# Patient Record
Sex: Female | Born: 1960 | Hispanic: Refuse to answer | Marital: Married | Smoking: Never smoker
Health system: Southern US, Community
[De-identification: ages and names within clinical notes are randomized; demographics above are authoritative.]

## PROBLEM LIST (undated history)

## (undated) DIAGNOSIS — T7840XA Allergy, unspecified, initial encounter: Secondary | ICD-10-CM

## (undated) DIAGNOSIS — M797 Fibromyalgia: Secondary | ICD-10-CM

## (undated) DIAGNOSIS — K635 Polyp of colon: Secondary | ICD-10-CM

## (undated) DIAGNOSIS — E119 Type 2 diabetes mellitus without complications: Secondary | ICD-10-CM

## (undated) HISTORY — PX: TONSILLECTOMY: SUR1361

## (undated) HISTORY — PX: TOTAL ABDOMINAL HYSTERECTOMY: SHX209

## (undated) HISTORY — DX: Polyp of colon: K63.5

## (undated) HISTORY — PX: FEMUR FRACTURE SURGERY: SHX633

## (undated) HISTORY — DX: Allergy, unspecified, initial encounter: T78.40XA

## (undated) HISTORY — DX: Fibromyalgia: M79.7

## (undated) HISTORY — PX: BLADDER SURGERY: SHX569

## (undated) HISTORY — PX: CHOLECYSTECTOMY: SHX55

## (undated) HISTORY — PX: APPENDECTOMY: SHX54

## (undated) HISTORY — PX: LEG SURGERY: SHX1003

---

## 2010-10-07 ENCOUNTER — Encounter: Payer: Self-pay | Admitting: Pulmonary Disease

## 2010-10-07 ENCOUNTER — Other Ambulatory Visit: Payer: Self-pay | Admitting: Pulmonary Disease

## 2010-10-07 ENCOUNTER — Institutional Professional Consult (permissible substitution) (INDEPENDENT_AMBULATORY_CARE_PROVIDER_SITE_OTHER): Payer: BC Managed Care – PPO | Admitting: Pulmonary Disease

## 2010-10-07 ENCOUNTER — Ambulatory Visit (INDEPENDENT_AMBULATORY_CARE_PROVIDER_SITE_OTHER)
Admission: RE | Admit: 2010-10-07 | Discharge: 2010-10-07 | Disposition: A | Discharge status: Home / Self Care | Payer: BC Managed Care – PPO | Source: Ambulatory Visit | Attending: Pulmonary Disease | Admitting: Pulmonary Disease

## 2010-10-07 DIAGNOSIS — J209 Acute bronchitis, unspecified: Secondary | ICD-10-CM

## 2010-10-07 DIAGNOSIS — J309 Allergic rhinitis, unspecified: Secondary | ICD-10-CM | POA: Insufficient documentation

## 2010-10-07 DIAGNOSIS — K219 Gastro-esophageal reflux disease without esophagitis: Secondary | ICD-10-CM

## 2010-10-07 DIAGNOSIS — J45909 Unspecified asthma, uncomplicated: Secondary | ICD-10-CM | POA: Insufficient documentation

## 2010-10-14 NOTE — Assessment & Plan Note (Signed)
Summary: self referral for cough, congestion, reflux.   Vital Signs:  Patient profile:   50 year old female Height:      60 inches Weight:      151 pounds BMI:     29.60 O2 Sat:      98 % on Room air Temp:     98.1 degrees F oral Pulse rate:   75 / minute BP sitting:   126 / 80  (left arm) Cuff size:   regular  Vitals Entered By: Arman Filter LPN (October 07, 2010 3:58 PM)  O2 Flow:  Room air CC: Pulmonary Consult Comments Medications reviewed with patient Arman Filter LPN  October 07, 2010 4:00 PM    Copy to:  Self Referral Primary Provider/Referring Provider:  None  CC:  Pulmonary Consult.  History of Present Illness: The pt is a 49y/o female who comes in today as a self referral for cough, congestion, and chest tightness.  She just moved here from [place] 2 weeks ago, and was doing well until 4 days ago.  She began to develop cough, congestion, scant quantity of yellow mucus, and chest tightness.  She also had some increase in sob and severe reflux symptoms.  It should be noted the entire history was obtained thru an interpreter.  She apparently has a h/o asthma for the last 3 yrs, and has been maintained on symbicort.  She has been well controlled, and notes only one flare in the last year besides this episode.  She feels this is different from prior respiratory illnesses.  She recently saw someone at "urgent care", and was put on biaxin.  She has not been able to take this due to worsening reflux symptoms.  She has never been treated with prednisone to her knowledge.  She has noted some sinus congestion with nonpurulent drainage, but this is not an ongoing problem.    Past History:  Past Medical History: ?h/o asthma ALLERGIC RHINITIS (ICD-477.9)    Past Surgical History: total hysterectomy appendectomy   Family History: Reviewed history and no changes required. noncontributory  Social History: Reviewed history and no changes required. single, no  children never smoked moved here 2 weeks ago from [place]  Review of Systems       The patient complains of productive cough, chest pain, and acid heartburn.  The patient denies shortness of breath with activity, shortness of breath at rest, non-productive cough, coughing up blood, irregular heartbeats, indigestion, loss of appetite, weight change, abdominal pain, difficulty swallowing, sore throat, tooth/dental problems, headaches, nasal congestion/difficulty breathing through nose, sneezing, itching, ear ache, anxiety, depression, hand/feet swelling, joint stiffness or pain, rash, change in color of mucus, and fever.    Physical Exam  General:  ow female in nad Eyes:  PERRLA and EOMI.   Nose:  patent without discharge no purulence seen Mouth:  clear, no thrush or exudates seen Neck:  no jvd,tmg, LN Lungs:  clear to auscultation, no wheezing or rhonchi Heart:  rrr, no mrg  Abdomen:  soft and nontender, bs+ Extremities:  no edema or cyanosis  pulses intact distally Neurologic:  alert and oriented, moves all 4. extremities.   Impression & Recommendations:  Problem # 1:  BRONCHITIS, ACUTE (ICD-466.0) the pt's hx is most c/w an acute bronchitis, with cough/congestion and purulence.  This may have been all started by reflux based on her history.  Will treat with avelox since she was unable to tolerate biaxin.  Will also check a  cxr to make sure this isn't pna or another acute process.  Problem # 2:  GERD, SEVERE (ICD-530.81) the pt is describing chest tightness with sob, but is also having severe reflux symptoms relayed thru the interpreter.  I initially though this may be due to an asthma flare, but her spirometry was totally normal and her exam had no wheezing.  I suspect a lot of this is due to reflux, but again will check cxr for completeness.  Will treat with two times a day ppi, and will have to consider at f/u if she even truly has asthma.  She is to stay on her maintenance  asthma meds at this time.  Medications Added to Medication List This Visit: 1)  Omeprazole 40 Mg Cpdr (Omeprazole) .... One in am and pm  Other Orders: New Patient Level V (16109) Spirometry w/Graph (94010) T-2 View CXR (71020TC)  Patient Instructions: 1)  stay on symbicort 2 puffs am and pm...rinse mouth well after using and gargle.  Take everyday no matter how you feel. 2)  proventil inhaler 2 puffs every 6hrs only if an emergency and you are away from home.  Only use your nebulizer at home if you are having a lot of problems. 3)  will check cxr today, and call you with results. 4)  dexilant 60mg  one each am and pm each day...for acid reflux until samples are gone, then start taking omeprazole 40mg  one am and pm thereafter. 5)  avelox 400mg  one each day for 5 days for bronchitis 6)  followup with me in 2 weeks, but call if not getting better.  Prescriptions: OMEPRAZOLE 40 MG CPDR (OMEPRAZOLE) one in am and pm  #60 x 6   Entered and Authorized by:   Barbaraann Share MD   Signed by:   Barbaraann Share MD on 10/07/2010   Method used:   Print then Give to Patient   RxID:   [id number]    Orders Added: 1)  New Patient Level V [99205] 2)  Spirometry w/Graph [94010] 3)  T-2 View CXR [71020TC]

## 2010-10-21 ENCOUNTER — Ambulatory Visit (INDEPENDENT_AMBULATORY_CARE_PROVIDER_SITE_OTHER): Payer: BC Managed Care – PPO | Admitting: Pulmonary Disease

## 2010-10-21 ENCOUNTER — Encounter: Payer: Self-pay | Admitting: Pulmonary Disease

## 2010-10-21 DIAGNOSIS — K219 Gastro-esophageal reflux disease without esophagitis: Secondary | ICD-10-CM

## 2010-10-21 DIAGNOSIS — J45909 Unspecified asthma, uncomplicated: Secondary | ICD-10-CM

## 2010-11-12 NOTE — Assessment & Plan Note (Signed)
Summary: rov for ?asthma, severe reflux   Copy to:  Self Referral Primary Provider/Referring Provider:  None  CC:  2 week f/u appt.  Pt states she has finished abx and is "feeling better."  Denies sob.  States she will cough up small amounts of yellow sputum. Shannon Schmitt  History of Present Illness: the pt comes in today for f/u of her pulmonary symptoms.  She has a h/o "asthma", and has been having worsening symptoms despite being on a good asthma regimen.  She has also been having severe reflux symptoms, and this was felt to be aggrevating her asthma symptoms.  Last visit she was treated with an abx for probable acute bronchitis, and also started on two times a day PPI.  She comes in today where she is much improved.  She has not had breakthru reflux, and no chest tightness/burning.  Her breathing is much improved.    Current Medications (verified): 1)  Omeprazole 40 Mg Cpdr (Omeprazole) .... One in Am and Pm 2)  Symbicort 160-4.5 Mcg/act  Aero (Budesonide-Formoterol Fumarate) .... Two Puffs Twice Daily 3)  Proventil Hfa 108 (90 Base) Mcg/act  Aers (Albuterol Sulfate) .Shannon Schmitt.. 1-2 Puffs Every 4-6 Hours As Needed  Allergies (verified): 1)  ! Penicillin 2)  ! Aspirin 3)  ! Codeine 4)  ! * Shellfish  Review of Systems       The patient complains of productive cough, acid heartburn, indigestion, headaches, and change in color of mucus.  The patient denies shortness of breath with activity, shortness of breath at rest, non-productive cough, coughing up blood, chest pain, irregular heartbeats, loss of appetite, weight change, abdominal pain, difficulty swallowing, sore throat, tooth/dental problems, nasal congestion/difficulty breathing through nose, sneezing, itching, ear ache, anxiety, depression, hand/feet swelling, joint stiffness or pain, rash, and fever.    Vital Signs:  Patient profile:   50 year old female Height:      60 inches Weight:      151.38 pounds O2 Sat:      94 % on Room air Temp:      97.8 degrees F oral Pulse rate:   87 / minute BP sitting:   106 / 76  (left arm) Cuff size:   regular  Vitals Entered By: Arman Filter LPN (October 21, 2010 9:11 AM)  O2 Flow:  Room air CC: 2 week f/u appt.  Pt states she has finished abx and is "feeling better."  Denies sob.  States she will cough up small amounts of yellow sputum.  Comments Medications reviewed with patient Arman Filter LPN  October 21, 2010 9:11 AM    Physical Exam  General:  ow female in nad Lungs:  totally clear to auscultation Heart:  rrr Extremities:  no edema or cyanosis  Neurologic:  alert and oriented, moves all 4.   Impression & Recommendations:  Problem # 1:  INTRINSIC ASTHMA, UNSPECIFIED (ICD-493.10) the pt is much improved from a pulmonary standpoint, with only a minimal cough residual.  It remains unclear to me if she truly has asthma vs RAD associated with severe reflux.  Will continue her on her current regimen, and let the "smoke clear".  If she is doing well at the next visit, I would try to stop the symbicort and see if she has recurrence of symptoms while on aggressive treatment for acid reflux.   Problem # 2:  GERD, SEVERE (ICD-530.81) much improved on two times a day PPI.  I have asked her to continue.  Medications Added to Medication List This Visit: 1)  Symbicort 160-4.5 Mcg/act Aero (Budesonide-formoterol fumarate) .... Two puffs twice daily 2)  Proventil Hfa 108 (90 Base) Mcg/act Aers (Albuterol sulfate) .Shannon Schmitt.. 1-2 puffs every 4-6 hours as needed  Other Orders: Est. Patient Level III (16109)  Patient Instructions: 1)  stay on symbicort, and proventil as needed 2)  stay on acid reflux medication one in am and pm 3)  please call if you are not doing well 4)  followup with me in 2mos.   Prescriptions: PROVENTIL HFA 108 (90 BASE) MCG/ACT  AERS (ALBUTEROL SULFATE) 1-2 puffs every 4-6 hours as needed  #1 x 6   Entered and Authorized by:   Barbaraann Share MD   Signed by:   Barbaraann Share MD on 10/21/2010   Method used:   Print then Give to Patient   RxID:   [id number] SYMBICORT 160-4.5 MCG/ACT  AERO (BUDESONIDE-FORMOTEROL FUMARATE) Two puffs twice daily  #1 x 6   Entered and Authorized by:   Barbaraann Share MD   Signed by:   Barbaraann Share MD on 10/21/2010   Method used:   Print then Give to Patient   RxID:   [id number]

## 2010-12-05 ENCOUNTER — Emergency Department (HOSPITAL_COMMUNITY)
Admission: EM | Admit: 2010-12-05 | Discharge: 2010-12-05 | Disposition: A | Discharge status: Home / Self Care | Payer: MEDICARE | Attending: Emergency Medicine | Admitting: Emergency Medicine

## 2010-12-05 DIAGNOSIS — H5710 Ocular pain, unspecified eye: Secondary | ICD-10-CM | POA: Insufficient documentation

## 2010-12-05 DIAGNOSIS — B0052 Herpesviral keratitis: Secondary | ICD-10-CM | POA: Insufficient documentation

## 2010-12-18 ENCOUNTER — Encounter: Payer: Self-pay | Admitting: Pulmonary Disease

## 2010-12-21 ENCOUNTER — Ambulatory Visit: Payer: BC Managed Care – PPO | Admitting: Pulmonary Disease

## 2011-10-26 ENCOUNTER — Other Ambulatory Visit: Payer: Self-pay | Admitting: Pulmonary Disease

## 2012-09-29 ENCOUNTER — Ambulatory Visit: Payer: MEDICARE | Admitting: Adult Health

## 2012-09-29 ENCOUNTER — Encounter: Payer: Self-pay | Admitting: Pulmonary Disease

## 2012-09-29 ENCOUNTER — Ambulatory Visit (INDEPENDENT_AMBULATORY_CARE_PROVIDER_SITE_OTHER): Payer: BC Managed Care – PPO | Admitting: Pulmonary Disease

## 2012-09-29 VITALS — BP 120/72 | HR 65 | Temp 97.5°F | Ht <= 58 in | Wt 162.4 lb

## 2012-09-29 DIAGNOSIS — J45901 Unspecified asthma with (acute) exacerbation: Secondary | ICD-10-CM

## 2012-09-29 DIAGNOSIS — K219 Gastro-esophageal reflux disease without esophagitis: Secondary | ICD-10-CM

## 2012-09-29 DIAGNOSIS — J45909 Unspecified asthma, uncomplicated: Secondary | ICD-10-CM

## 2012-09-29 MED ORDER — PREDNISONE 10 MG PO TABS: ORAL_TABLET | ORAL | Status: DC

## 2012-09-29 MED ORDER — LEVOFLOXACIN 750 MG PO TABS: 750.0000 mg | ORAL_TABLET | Freq: Every day | ORAL | Status: DC

## 2012-09-29 NOTE — Assessment & Plan Note (Signed)
The patient's history is most consistent with an acute bronchitis with asthma exacerbation.  She will need to be treated with a course of prednisone and antibiotics, and will also need to get back on her maintenance medications.  I have given her samples of symbicort, and written a prescription.  Also stressed to her the importance of staying on her reflux medication.

## 2012-09-29 NOTE — Patient Instructions (Addendum)
Will treat with levaquin 750mg  one a day for 5 days Start prednisone taper as written. Stop your nebulizer treatments. Lets get you back on symbicort 160/4.5, 2 puffs am and pm everyday.  Keep mouth rinsed.  Use your albuterol for rescue if needed. Stay on your reflux medication followup with me in 4mos.

## 2012-09-29 NOTE — Progress Notes (Signed)
  Subjective:    Patient ID: Shannon Schmitt, female    DOB: 18-Dec-1960, 52 y.o.   MRN: [id number]  HPI The patient comes in today for an acute sick visit.  She has not been seen in 2 years, and has been seen in the past for probable asthma with difficult control due to reflux disease.  She comes in today with at least a one-week history of increasing chest congestion, cough with purulent mucus, as well as increased shortness of breath.  This did not start in her upper respiratory tract, and she has not had any fevers or other flulike symptoms.  She has run out of her maintenance symbicort, has been using nebulizer treatments from [place] as needed.   Review of Systems  Constitutional: Negative for fever, chills and unexpected weight change.  HENT: Positive for congestion, sore throat and sinus pressure. Negative for ear pain, nosebleeds, rhinorrhea, sneezing, trouble swallowing, dental problem and postnasal drip.   Eyes: Negative for redness and itching.  Respiratory: Positive for cough, chest tightness, shortness of breath and wheezing.   Cardiovascular: Positive for chest pain and palpitations ( with heavy coughing). Negative for leg swelling.  Gastrointestinal: Negative for nausea and vomiting.  Genitourinary: Negative for dysuria.  Musculoskeletal: Negative for joint swelling.  Skin: Negative for rash.  Neurological: Negative for headaches.  Hematological: Does not bruise/bleed easily.  Psychiatric/Behavioral: Negative for dysphoric mood. The patient is not nervous/anxious.        Objective:   Physical Exam Overweight female in no acute distress Nose with purulent discharge noted Oropharynx clear Neck without lymphadenopathy or thyromegaly Chest with rhonchi diffusely, no crackles or wheezes. Cardiac exam with regular rate and rhythm Lower extremities without edema, no cyanosis Alert and oriented, moves all 4 extremities.       Assessment & Plan:

## 2013-01-31 ENCOUNTER — Ambulatory Visit: Payer: BC Managed Care – PPO | Admitting: Pulmonary Disease

## 2014-04-12 ENCOUNTER — Encounter (HOSPITAL_COMMUNITY): Payer: Self-pay | Admitting: Emergency Medicine

## 2014-04-12 ENCOUNTER — Emergency Department (HOSPITAL_COMMUNITY)
Admission: EM | Admit: 2014-04-12 | Discharge: 2014-04-12 | Disposition: A | Discharge status: Home / Self Care | Payer: Managed Care, Other (non HMO) | Attending: Emergency Medicine | Admitting: Emergency Medicine

## 2014-04-12 ENCOUNTER — Emergency Department (HOSPITAL_COMMUNITY): Payer: Managed Care, Other (non HMO)

## 2014-04-12 DIAGNOSIS — Z880 Allergy status to penicillin: Secondary | ICD-10-CM | POA: Diagnosis not present

## 2014-04-12 DIAGNOSIS — R42 Dizziness and giddiness: Secondary | ICD-10-CM | POA: Diagnosis present

## 2014-04-12 DIAGNOSIS — Z79899 Other long term (current) drug therapy: Secondary | ICD-10-CM | POA: Diagnosis not present

## 2014-04-12 DIAGNOSIS — R112 Nausea with vomiting, unspecified: Secondary | ICD-10-CM | POA: Diagnosis not present

## 2014-04-12 DIAGNOSIS — J45909 Unspecified asthma, uncomplicated: Secondary | ICD-10-CM | POA: Insufficient documentation

## 2014-04-12 DIAGNOSIS — R1013 Epigastric pain: Secondary | ICD-10-CM | POA: Diagnosis not present

## 2014-04-12 LAB — CBC WITH DIFFERENTIAL/PLATELET
Basophils Absolute: 0 10*3/uL (ref 0.0–0.1)
Basophils Relative: 0 % (ref 0–1)
EOS ABS: 0.1 10*3/uL (ref 0.0–0.7)
EOS PCT: 2 % (ref 0–5)
HEMATOCRIT: 37.7 % (ref 36.0–46.0)
HEMOGLOBIN: 12.5 g/dL (ref 12.0–15.0)
LYMPHS ABS: 1.8 10*3/uL (ref 0.7–4.0)
Lymphocytes Relative: 28 % (ref 12–46)
MCH: 28.4 pg (ref 26.0–34.0)
MCHC: 33.2 g/dL (ref 30.0–36.0)
MCV: 85.7 fL (ref 78.0–100.0)
MONO ABS: 0.4 10*3/uL (ref 0.1–1.0)
MONOS PCT: 6 % (ref 3–12)
NEUTROS PCT: 64 % (ref 43–77)
Neutro Abs: 4.1 10*3/uL (ref 1.7–7.7)
Platelets: 208 10*3/uL (ref 150–400)
RBC: 4.4 MIL/uL (ref 3.87–5.11)
RDW: 13.8 % (ref 11.5–15.5)
WBC: 6.4 10*3/uL (ref 4.0–10.5)

## 2014-04-12 LAB — COMPREHENSIVE METABOLIC PANEL
ALT: 110 U/L — ABNORMAL HIGH (ref 0–35)
ANION GAP: 15 (ref 5–15)
AST: 53 U/L — ABNORMAL HIGH (ref 0–37)
Albumin: 3.9 g/dL (ref 3.5–5.2)
Alkaline Phosphatase: 165 U/L — ABNORMAL HIGH (ref 39–117)
BILIRUBIN TOTAL: 0.6 mg/dL (ref 0.3–1.2)
BUN: 12 mg/dL (ref 6–23)
CHLORIDE: 103 meq/L (ref 96–112)
CO2: 24 meq/L (ref 19–32)
CREATININE: 0.54 mg/dL (ref 0.50–1.10)
Calcium: 9.4 mg/dL (ref 8.4–10.5)
GFR calc Af Amer: >90 mL/min (ref >90)
GLUCOSE: 181 mg/dL — AB (ref 70–99)
Potassium: 3.9 mEq/L (ref 3.7–5.3)
Sodium: 142 mEq/L (ref 137–147)
Total Protein: 7.7 g/dL (ref 6.0–8.3)

## 2014-04-12 LAB — LIPASE, BLOOD: LIPASE: 42 U/L (ref 11–59)

## 2014-04-12 MED ORDER — ONDANSETRON HCL 4 MG/2ML IJ SOLN
4.0000 mg | Freq: Once | INTRAMUSCULAR | Status: AC
  Administered 2014-04-12: 4 mg via INTRAVENOUS
  Filled 2014-04-12: qty 2

## 2014-04-12 MED ORDER — ONDANSETRON 4 MG PO TBDP: 4.0000 mg | ORAL_TABLET | Freq: Three times a day (TID) | ORAL | Status: DC | PRN

## 2014-04-12 MED ORDER — SODIUM CHLORIDE 0.9 % IV BOLUS (SEPSIS)
1000.0000 mL | Freq: Once | INTRAVENOUS | Status: AC
  Administered 2014-04-12: 1000 mL via INTRAVENOUS

## 2014-04-12 MED ORDER — GI COCKTAIL ~~LOC~~
30.0000 mL | Freq: Once | ORAL | Status: AC
  Administered 2014-04-12: 30 mL via ORAL
  Filled 2014-04-12: qty 30

## 2014-04-12 NOTE — ED Provider Notes (Signed)
CSN: [id number]     Arrival date & time 04/12/14  0554 History   First MD Initiated Contact with Patient 04/12/14 [id number]     Chief Complaint  Patient presents with  . Emesis  . Dizziness     (Consider location/radiation/quality/duration/timing/severity/associated sxs/prior Treatment) HPI Comments: Patient presents today with a chief complaint of dizziness, nausea, and vomiting.  She reports that she began having symptoms approximately one hour prior to arrival.  She reports 2-3 episodes of vomiting.  She denies any blood in her emesis.  She has not taken anything for her symptoms prior to arrival.  She denies fever, chills, diarrhea, abdominal pain, headache, vision changes, syncope, chest pain, or SOB.  She denies recent foreign travel.  She denies any known sick contacts.  Past surgical history significant for Appendectomy and Total Abdominal Hysterectomy.    Patient is a 53 y.o. female presenting with vomiting and dizziness. The history is provided by the patient.  Emesis Dizziness Associated symptoms: vomiting     Past Medical History  Diagnosis Date  . Allergic   . Asthma    Past Surgical History  Procedure Laterality Date  . Total abdominal hysterectomy    . Appendectomy     No family history on file. History  Substance Use Topics  . Smoking status: Never Smoker   . Smokeless tobacco: Not on file  . Alcohol Use: No   OB History   Grav Para Term Preterm Abortions TAB SAB Ect Mult Living                 Review of Systems  Gastrointestinal: Positive for vomiting.  Neurological: Positive for dizziness.  All other systems reviewed and are negative.     Allergies  Aspirin; Codeine; Penicillins; and Shellfish allergy  Home Medications   Prior to Admission medications   Medication Sig Start Date End Date Taking? Authorizing Provider  albuterol (PROVENTIL HFA) 108 (90 BASE) MCG/ACT inhaler Inhale 2 puffs into the lungs every 6 (six) hours as needed.     Yes  Historical Provider, MD  lansoprazole (PREVACID) 30 MG capsule Take 30 mg by mouth daily.    Historical Provider, MD   BP 146/82  Pulse 73  Temp(Src) 97.5 F (36.4 C)  Resp 26  Ht 4\' 9"  (1.448 m)  Wt 160 lb (72.576 kg)  BMI 34.61 kg/m2  SpO2 100% Physical Exam  Nursing note and vitals reviewed. Constitutional: She appears well-developed and well-nourished.  HENT:  Head: Normocephalic and atraumatic.  Mouth/Throat: Oropharynx is clear and moist.  Eyes: EOM are normal. Pupils are equal, round, and reactive to light.  Neck: Normal range of motion. Neck supple.  Cardiovascular: Normal rate, regular rhythm and normal heart sounds.   Pulmonary/Chest: Effort normal and breath sounds normal.  Abdominal: Soft. Normal appearance and bowel sounds are normal. She exhibits no distension and no mass. There is tenderness in the epigastric area. There is no rebound and no guarding.  Musculoskeletal: Normal range of motion.  Neurological: She is alert. She has normal strength. No cranial nerve deficit. Gait normal.  Skin: Skin is warm and dry.  Psychiatric: She has a normal mood and affect.    ED Course  Procedures (including critical care time) Labs Review Labs Reviewed  CBC WITH DIFFERENTIAL  COMPREHENSIVE METABOLIC PANEL  LIPASE, BLOOD    Imaging Review US Abdomen Complete  04/12/2014   CLINICAL DATA:  Right upper quadrant pain .  EXAM: ULTRASOUND ABDOMEN COMPLETE  COMPARISON:  None.  FINDINGS: Gallbladder:  Cholecystectomy.  Common bile duct:  Diameter: 5.4 mm.  Liver:  Increased echogenicity consistent with fatty infiltration and/or hepatocellular disease.  IVC:  No abnormality visualized.  Pancreas:  Visualized portion unremarkable.  Spleen:  Size and appearance within normal limits.  Right Kidney:  Length: 10.4 cm. Echogenicity within normal limits. No mass or hydronephrosis visualized.  Left Kidney:  Length: 10.7 cm. Echogenicity within normal limits. No mass or hydronephrosis  visualized.  Abdominal aorta:  No aneurysm visualized.  Other findings:  None.  IMPRESSION: 1. Cholecystectomy.  2. Echogenic liver suggesting fatty infiltration and/or hepatocellular disease.   Electronically Signed   By: Marcello Moores  Register   On: 04/12/2014 09:08     EKG Interpretation None     9:36 AM Patient reports that dizziness has resolved.  She also reports improvement in nausea.  Will fluid challenge and reassess. 10:19 AM Patient tolerating PO liquids. MDM   Final diagnoses:  None   Patient presenting with nausea and vomiting that began this morning.  Labs showing elevated LFT's, but otherwise unremarkable.  Patient is afebrile.  Epigastric abdominal tenderness to palpation, but no rebound or guarding.  Abdominal ultrasound showing echogenic liver suggesting fatty infiltration or hepatocellular disease.  Pain and nausea controlled prior to discharge.  Patient stable for discharge.  Return precautions given.    Hyman Bible, PA-C 04/12/14 1620

## 2014-04-12 NOTE — ED Notes (Signed)
Pt coming from home with c/o of emesis and dizziness that started today about 5:00am.  Pt denies pain.

## 2014-04-12 NOTE — ED Notes (Signed)
PA at bedside.

## 2014-04-15 NOTE — ED Provider Notes (Signed)
Medical screening examination/treatment/procedure(s) were conducted as a shared visit with non-physician practitioner(s) and myself.  I personally evaluated the patient during the encounter.  Pt c/o nv, episodic, not bloody or bilious. abd soft nt. Labs. Ivf.    Mirna Mires, MD 04/15/14 [phone]

## 2015-03-26 ENCOUNTER — Emergency Department (HOSPITAL_COMMUNITY): Payer: Commercial Indemnity

## 2015-03-26 ENCOUNTER — Emergency Department (HOSPITAL_COMMUNITY)
Admission: EM | Admit: 2015-03-26 | Discharge: 2015-03-26 | Disposition: A | Discharge status: Home / Self Care | Payer: Commercial Indemnity | Attending: Emergency Medicine | Admitting: Emergency Medicine

## 2015-03-26 ENCOUNTER — Encounter (HOSPITAL_COMMUNITY): Payer: Self-pay | Admitting: Family Medicine

## 2015-03-26 DIAGNOSIS — Z880 Allergy status to penicillin: Secondary | ICD-10-CM | POA: Diagnosis not present

## 2015-03-26 DIAGNOSIS — M545 Low back pain, unspecified: Secondary | ICD-10-CM

## 2015-03-26 DIAGNOSIS — E119 Type 2 diabetes mellitus without complications: Secondary | ICD-10-CM | POA: Diagnosis not present

## 2015-03-26 DIAGNOSIS — M546 Pain in thoracic spine: Secondary | ICD-10-CM | POA: Insufficient documentation

## 2015-03-26 DIAGNOSIS — Z3202 Encounter for pregnancy test, result negative: Secondary | ICD-10-CM | POA: Diagnosis not present

## 2015-03-26 HISTORY — DX: Type 2 diabetes mellitus without complications: E11.9

## 2015-03-26 LAB — URINALYSIS, ROUTINE W REFLEX MICROSCOPIC
Bilirubin Urine: NEGATIVE
Glucose, UA: NEGATIVE mg/dL
HGB URINE DIPSTICK: NEGATIVE
KETONES UR: NEGATIVE mg/dL
LEUKOCYTES UA: NEGATIVE
NITRITE: NEGATIVE
PROTEIN: NEGATIVE mg/dL
Specific Gravity, Urine: 1.03 (ref 1.005–1.030)
UROBILINOGEN UA: 0.2 mg/dL (ref 0.0–1.0)
pH: 5.5 (ref 5.0–8.0)

## 2015-03-26 LAB — PREGNANCY, URINE: Preg Test, Ur: NEGATIVE

## 2015-03-26 MED ORDER — ACETAMINOPHEN 500 MG PO TABS: 500.0000 mg | ORAL_TABLET | Freq: Four times a day (QID) | ORAL | Status: DC | PRN

## 2015-03-26 MED ORDER — CYCLOBENZAPRINE HCL 10 MG PO TABS: 10.0000 mg | ORAL_TABLET | Freq: Two times a day (BID) | ORAL | Status: DC | PRN

## 2015-03-26 MED ORDER — ACETAMINOPHEN 325 MG PO TABS
650.0000 mg | ORAL_TABLET | Freq: Once | ORAL | Status: AC
  Administered 2015-03-26: 650 mg via ORAL
  Filled 2015-03-26: qty 2

## 2015-03-26 MED ORDER — ORPHENADRINE CITRATE ER 100 MG PO TB12: 100.0000 mg | ORAL_TABLET | Freq: Two times a day (BID) | ORAL | Status: DC

## 2015-03-26 NOTE — Discharge Instructions (Signed)
Dolor de espalda en el adulto (Back Pain, Adult)  El dolor de cintura es frecuente. Aproximadamente 1 de cada 5 personas lo sufren.La causa rara vez pone en peligro la vida. Con frecuencia mejora luego de algn tiempo.Alrededor de la mitad de las personas que sufren un inicio sbito de dolor de cintura, se sentirn mejor luego de 2 semanas. Aproximadamente 8 de cada 10 se sentirn mejor luego de 6 semanas.  CAUSAS  Algunas causas comunes son:   Distensin de los msculos o ligamentos que sostienen la columna vertebral.  Desgaste (degeneracin) de los discos vertebrales.  Artritis.  Traumatismos directos en la espalda. DIAGNSTICO  La mayor parte de las veces, la causa directa no se conoce.Sin embargo, Conservation officer, historic buildings puede tratarse efectivamente an cuando no se Community education officer.Una de las formas ms precisas de asegurar que la causa del dolor no constituye un peligro es responder a las preguntas del mdico acerca de su salud y sus sntomas. Si el mdico necesita ms informacin, podr indicar anlisis de laboratorio o Optometrist un diagnstico por imgenes (radiografas o Health visitor).Sin embargo, aunque las [employer] modificaciones, generalmente no es necesaria la [place].  INSTRUCCIONES PARA EL CUIDADO EN EL HOGAR  En algunas personas, el dolor de espalda vuelve.Como rara vez es peligroso, los pacientes pueden aprender a Education administrator.   Mantngase activo. Si permanece sentado o de pie mucho tiempo en el mismo lugar, se tensiona la espalda.  No se siente, maneje ni se quede parado en un mismo lugar por ms de 30 minutos. Realice caminatas cortas en superficies planas ni bien el dolor haya cedido. Trate de Orthoptist tiempo que camina .  No se quede en la cama.Si hace reposo durante ms de 1 o 2 das, puede Geologist, engineering.  No evite los ejercicios ni el trabajo.El cuerpo est hecho para moverse.No es peligroso estar [place], aunque le duela la  espalda.La espalda se curar ms rpido si contina sus actividades antes de que el dolor se vaya.  Preste atencin a su cuerpo cuando se incline y se levante. Muchas personas sienten menos molestias cuando levantan objetos si doblan las rodillas, mantienen la carga cerca del cuerpo y evitan torcerse. Generalmente, las posiciones ms cmodas son las que ejercen menos tensin en la espalda en recuperacin.  Encuentre una posicin cmoda para dormir. Utilice un colchn firme y recustese de [place]. Doble ligeramente sus rodillas. Si se recuesta sobre su espalda, coloque una almohada debajo de sus rodillas.  Tome slo medicamentos de venta libre o recetados, segn las indicaciones del mdico. Los medicamentos de venta libre para Glass blower/designer y reducir Futures trader, son los que en general ms ayudan.El mdico podr prescribirle relajantes musculares.Estos medicamentos calman el dolor de modo que pueda retornar a sus actividades normales y a Marine scientist.  Aplique hielo sobre la zona lesionada.  Ponga el hielo en una bolsa plstica.  Colquese una toalla entre la piel y la bolsa de hielo.  Deje la bolsa de hielo durante 15 a 20 minutos 3 a 4 veces por da, durante los primeros 2  3 das. Luego podr alternar Lyndal Pulley calor y 60 para reducir Conservation officer, historic buildings y los espasmos.  Consulte a su mdico si puede tratar de hacer ejercicios para la espalda y recibir un masaje suave. Pueden ser beneficiosos.  Evite sentirse ansioso o estresado.El estrs aumenta la tensin muscular y puede empeorar el dolor de espalda.Es importante reconocer cuando est ansioso o estresado y aprender la forma  de controlarlos.El ejercicio es una gran opcin. SOLICITE ATENCIN MDICA SI:   Siente un dolor que no se alivia con reposo o medicamentos.  El dolor no mejora en 1 semana.  Desarrolla nuevos sntomas.  No se siente bien en general. SOLICITE ATENCIN MDICA DE INMEDIATO SI:  Siente un dolor  que se irradia desde la espalda hacia sus piernas.  Desarrolla nuevos problemas en el intestino o la vejiga.  Siente debilidad o adormecimiento inusual en sus brazos o piernas.  Presenta nuseas o vmitos.  Presenta dolor abdominal.  Se siente desfalleciente. Document Released: 08/23/2005 Document Revised: 02/22/2012 [facility] Patient Information 2015 [place], [place]. This information is not intended to replace advice given to you by your health care provider. Make sure you discuss any questions you have with your health care provider.   Ejercicios para la espalda (Back Exercises) Estos ejercicios ayudan a tratar y prevenir lesiones en la espalda. El objetivo es aumentar la fuerza de los msculos abdominales y dorsales y la flexibilidad de la espalda. Debe comenzar con estos ejercicios cuando ya no tenga dolor. Los ejercicios para la espalda incluyen:  Inclinacin de la pelvis - Recustese sobre la espalda con las rodillas flexionadas. Incline la pelvis hasta que la parte inferior de la espalda se apoye en el piso. Mantenga esta posicin durante 5 a 10 segundos y repita entre 5 y 47 veces.  Rodilla al pecho - Empuje primero una rodilla contra el pecho y [place] 20 a 30 segundos; repita con la otra rodilla y luego con ambas a la vez. Esto puede realizarlo con la otra pierna extendida o flexionada, del modo en que se sienta ms cmodo.  Abdominales o despegar el cccix del suelo empleando la musculatura abdominal - [place] 90 grados. Comience inclinando la pelvis y realice un ejercicio abdominal lento y parcial, elevando el tronco slo entre 77 y 44 grados del suelo. Emplee al [employer] 2 y 3 segundos para cada abdominal. No realice los abdominales con las rodillas extendidas. Si le resulta difcil realizar abdominales parciales, simplemente haga lo que se explic anteriormente, pero slo contraiga los msculos abdominales y Civil engineer, contracting tal como se le ha  indicado.  Inclinacin de la cadera - Recustese sobre la espalda con las rodillas flexionadas a 90 grados. Empjese con los pies y los hombros mientras eleva la cadera un par de centmetros del suelo, [place] durante 10 segundos y repita entre 5 y 10 veces.  Arcos dorsales - Acustese sobre el Schulter e impulse el tronco hacia atrs sobre los codos flexionados. Presione lentamente con las manos, formando un arco con la zona inferior de la espalda. Repita entre 3 y 5 veces. Al realizar las repeticiones, luego de un tiempo disminuirn la rigidez y las [employer].  Elevacin de los hombros - Acustese hacia abajo con los brazos a los lados del cuerpo. [place] y Photographer torso contra el suelo mientras eleva lentamente la cabeza y los hombros del suelo. No exagere con los ejercicios, especialmente en el comienzo. Los ejercicios pueden causar alguna molestia leve en la espalda durante algunos minutos; sin embargo, si el dolor es muy intenso, o dura ms de 15 minutos, no siga con la actividad fsica hasta que consulte al profesional que lo asiste. Los problemas en la espalda mejoran de [place] lenta con esta terapia.  Consulte al profesional para que lo ayude a planificar un programa de ejercicios adecuado para su espalda. Document Released: 08/23/2005 Document Revised: 11/15/2011 [facility] Patient Information 2015 [place].  This information is not intended to replace advice given to you by your health care provider. Make sure you discuss any questions you have with your health care provider.   Emergency Department Resource Guide 1) Find a Doctor and Pay Out of Pocket Although you won't have to find out who is covered by your insurance plan, it is a good idea to ask around and get recommendations. You will then need to call the office and see if the doctor you have chosen will accept you as a new patient and what types of options they offer for patients who are self-pay. Some doctors offer  discounts or will set up payment plans for their patients who do not have insurance, but you will need to ask so you aren't surprised when you get to your appointment.  2) Contact Your Local Health Department Not all health departments have doctors that can see patients for sick visits, but many do, so it is worth a call to see if yours does. If you don't know where your local health department is, you can check in your phone book. The CDC also has a tool to help you locate your state's health department, and many state websites also have listings of all of their local health departments.  3) Find a [facility] If your illness is not likely to be very severe or complicated, you may want to try a walk in clinic. These are popping up all over the country in pharmacies, drugstores, and shopping centers. They're usually staffed by nurse practitioners or physician assistants that have been trained to treat common illnesses and complaints. They're usually fairly quick and inexpensive. However, if you have serious medical issues or chronic medical problems, these are probably not your best option.  No Primary Care Doctor: - Call Health Connect at  [phone] - they can help you locate a primary care doctor that  accepts your insurance, provides certain services, etc. - Physician Referral Service- [phone]  Chronic Pain Problems: Organization         Address  Phone   Notes  [facility]  [phone] Patients need to be referred by their primary care doctor.   Medication Assistance: Organization         Address  Phone   Notes  [facility] Medication [facility] [address] [place], [place] [address] [phone] --Must be a resident of [facility] -- Must have NO insurance coverage whatsoever (no Medicaid/ Medicare, etc.) -- The pt. MUST have a primary care doctor that directs their care regularly and follows them in the community   MedAssist   [phone]   [employer]  [phone]    Agencies that provide inexpensive medical care: Organization         Address  Phone   Notes  [place]  [phone]   [facility]    [phone]   [facility], [place] [address] [phone]   [place] [address], [place] [phone]   Planned Parenthood    [phone]   [facility]    [phone]   [place] and [address], [place] Phone:  [phone], Fax:  [phone] Hours of Operation:  9 am - 6 pm, M-F.  Also accepts Medicaid/Medicare and self-pay.  [facility] for [place]  [place], [address], [place] Phone: [phone], Fax: [phone]. Hours of Operation:  8:30 am - 5:30 pm, M-F.  Also accepts Medicaid and self-pay.  [facility] [place] [address] [place] Phone: [phone]   [place], [place], [place] [phone], Ext. [phone] Mondays & Thursdays: 7-9 AM.  First 15 patients are seen on a first come, first serve basis.    [employer] Providers:  Organization         Address  Phone   Notes  [facility] [address], [place] [phone] Also accepts self-pay patients.  [facility] [address], [place]  [phone]   [place], [address], [place] [phone]   [facility] [address], [place] [phone]   Lucianne Lei [address], [place]   [phone] Only accepts [place] Access [place] patients after they have their name applied to their card.   Self-Pay (no insurance) in [facility]:  Organization         Address  Phone   Notes  Sickle Cell Patients, [facility] [place] [phone]   [facility] [place] [phone]   [facility] [place]  [place], [place],  [phone]   [facility]/Dr. Osei-Bonsu  [address], [place] or [address], [place] [phone] Phone number for both [place] and [place] locations is the same.  [facility] and [facility] [address], [place] [phone]   [facility] [address] [place] or [address] [phone]   [facility] [address] [place] [phone], phone; [phone], fax Sees patients 1st and 3rd Saturday of every month.  Must not qualify for public or private insurance (i.e. Medicaid, Medicare, [place] Health Choice, Veterans' Benefits)  Household income should be no more than 200% of the poverty level The clinic cannot treat you if you are pregnant or think you are pregnant  Sexually transmitted diseases are not treated at the clinic.    Dental Care: Organization         Address  Phone  Notes  [facility] Department of [facility] [place] [phone] Accepts children up to age 85 who are enrolled in [place] or [place]; pregnant women with a Medicaid card; and children who have applied for Medicaid or [place] Health Choice, but were declined, whose parents can pay a reduced fee at time of service.  [facility] Department of [facility]  [address], [place] [phone] Accepts children up to age 8 who are enrolled in [place] or [place]; pregnant women with a Medicaid card; and children who have applied for Medicaid or [place] Health Choice, but were declined, whose parents can pay a reduced fee at time of service.  [facility]  [place]  [phone] Patients are seen by appointment only. Walk-ins are not accepted. [place] will see patients 55 years of age and older. Monday - Tuesday (8am-5pm) Most Wednesdays (8:30-5pm) $30 per visit, cash only  [place]  PROGRAM  [address], [facility] [phone] Patients are seen by appointment only. Walk-ins are not accepted. [place] will see patients 62 years of age and older. One Wednesday Evening (Monthly: Volunteer Based).  $30 per visit, cash only  [place]  [phone] for adults; Children under age 76, call Graduate Pediatric Dentistry at [phone]. Children aged 45-14, please call [phone] to request a pediatric application.  Dental services are provided in all areas of dental care including fillings, crowns and bridges, complete and partial dentures, implants, gum treatment, root canals, and extractions. Preventive care is also provided. Treatment is provided to both adults and children. Patients are selected via a lottery and there is often a waiting list.   [facility] [address], [place]  [phone] [email or web address]   [facility] [address] [place], [place] [phone], Ext. [phone] Second and Fourth Thursday of each month, opens at 6:30 AM; Clinic ends at 9 AM.  Patients are seen on a first-come first-served basis, and a limited number are seen during each clinic.   [facility]  [address] Hillard Danker [place], [place] [phone]   Eligibility Requirements You must have lived in [place], [place], or [place] counties for at least the last three months.   You cannot be eligible for state or federal sponsored [employer], including [employer], [place], or [employer].   You generally cannot be eligible for healthcare insurance through your employer.    How to apply: Eligibility screenings are held every Tuesday and Wednesday  afternoon from 1:00 pm until 4:00 pm. You do not need an appointment for the interview!  [facility] [address], [place], [place]   [place]  [place] Department  [place]  [phone]    Behavioral Health Resources in the Community: Intensive Outpatient Programs Organization         Address  Phone  Notes  [place]. [address] [place], [place] [phone]   [facility] Outpatient [address], [place], [place]   ADS: Alcohol & Drug Svcs [address] [place], [place]   [place] [address],  [place], [place] or [phone]   Substance Abuse Resources Organization         Address  Phone  Notes  Alcohol and Drug Services  [phone]   [place]  [phone]   [facility]   Chinita Pester  [phone]   Residential & Outpatient Substance Abuse Program  [phone]   Psychological Services Organization         Address  Phone  Notes  [facility] [place]  [place]  [phone]   [place] [address] [place] or [phone]    Mobile Crisis Teams Organization         Address  Phone  Notes  [facility], Mobile Crisis Care Unit  [phone]   [facility]  [address] [place], [place]   [place] [address], [place] [phone]    Self-Help/Support Groups Organization         Address  Phone             Notes  [place]. of  [place] - variety of support groups  [place] Call for more information  Narcotics Anonymous (NA), [facility] [address], [employer] [place]  2 meetings at this location   Materials engineer         Address  Phone  Notes  [facility] [place],    [place]  [phone]   [facility]  [address], [place] [id number], [place], [place]   Ruby Hilda, [place] [phone] Admissions: 8am-3pm M-F  Incentives Substance [place] [address],    [place], [place] [phone]   [facility] [address] [place], [place], [place]   [facility] [address]  [place], [place]   [facility] - Intensive Outpatient [address] Kristeen [address], [place], [place]   [facility] ([place].) [place].,  [place], [place] [phone] or [phone]   [facility] (RTS) [address] [place], [place] Accepts Medicaid  [facility] [place] [address]  [place] [phone] Substance Abuse/Addiction Treatment   [facility] Organization         Address  Phone  Notes  [facility]  [phone]   Domenic Schwab, PhD [address] [place]lis [place], [place]   [phone] or [phone]   [place], [place] [phone]   [facility] [address], [place], [place] [phone] Insurance/Medicaid/sponsorship through [facility] and Families [address] [place]                                    [place], [place] [phone] [place] [address], [place] [phone]    Dr. Adele Schilder  [phone]   [facility] Dept. 1) [address] [place] 2) [place] 3)  Maish [address], [place] [phone]  [phone]   [place] [phone] or [phone] (After Hours)

## 2015-03-26 NOTE — ED Notes (Signed)
Pt standing up in room, grimacing. Husband in room.

## 2015-03-26 NOTE — ED Notes (Signed)
Pt here for mid back pain. sts x 2 days. Denies any other associated symptoms. Hurts with moving and walking. sts also right ear pain.

## 2015-03-26 NOTE — ED Provider Notes (Signed)
CSN: [id number]     Arrival date & time 03/26/15  0829 History  This chart was scribed for non-physician practitioner, Brent General, PA-C, working with Elnora Morrison, MD by Ladene Artist, ED Scribe. This patient was seen in room TR07C/TR07C and the patient's care was started at 9:01 AM.   Chief Complaint  Patient presents with  . Back Pain   The history is provided by the patient. The history is limited by a language barrier. A language interpreter was used.   HPI Comments: Shannon Schmitt is a 54 y.o. female, with a h/o DM, who presents to the Emergency Department complaining of sudden onset of constant mid back pain onset 2 days ago. Pt denies injury or heavy lifting. Pain is exacerbated with sitting, lying, and movement. She has tried Norflex without significant relief. She denies h/o similar back pain. Pt also denies weakness, numbness/tingling, saddle anesthesia, bladder or bowel incontinence, fever, nausea, vomiting. No h/o CA. Allergy to Aspirin and Codeine.    Past Medical History  Diagnosis Date  . Diabetes mellitus without complication    History reviewed. No pertinent past surgical history. History reviewed. No pertinent family history. History  Substance Use Topics  . Smoking status: Never Smoker   . Smokeless tobacco: Not on file  . Alcohol Use: No   OB History    No data available     Review of Systems  Constitutional: Negative for fever.  Gastrointestinal: Negative for nausea and vomiting.  Musculoskeletal: Positive for back pain.  Neurological: Negative for weakness and numbness.   Allergies  Asa; Codeine; Penicillins; and Shellfish allergy  Home Medications   Prior to Admission medications   Medication Sig Start Date End Date Taking? Authorizing Provider  acetaminophen (TYLENOL) 500 MG tablet Take 1 tablet (500 mg total) by mouth every 6 (six) hours as needed. 03/26/15   Dahlia Bailiff, PA-C  orphenadrine (NORFLEX) 100 MG tablet Take 1 tablet (100 mg  total) by mouth 2 (two) times daily. 03/26/15   Dahlia Bailiff, PA-C   BP 122/82 mmHg  Pulse 72  Temp(Src) 98 F (36.7 C)  Resp 18  Ht 4\' 9"  (1.448 m)  Wt 154 lb (69.854 kg)  BMI 33.32 kg/m2  SpO2 100% Physical Exam  Constitutional: She is oriented to person, place, and time. She appears well-developed and well-nourished. No distress.  HENT:  Head: Normocephalic and atraumatic.  Eyes: Conjunctivae and EOM are normal.  Neck: Neck supple. No tracheal deviation present.  Cardiovascular: Normal rate.   Pulmonary/Chest: Effort normal. No respiratory distress.  Musculoskeletal: Normal range of motion.  L1-L4 spinous process tenderness and paraspinous tenderness.   Neurological: She is alert and oriented to person, place, and time. She has normal strength. No cranial nerve deficit or sensory deficit. She displays a negative Romberg sign. Coordination and gait normal. GCS eye subscore is 4. GCS verbal subscore is 5. GCS motor subscore is 6.  Patient fully alert, answering questions appropriately in full, clear sentences. Cranial nerves II through XII grossly intact. Motor strength 5 out of 5 in all major muscle groups of upper and lower extremities. Distal sensation intact.   Skin: Skin is warm and dry.  Psychiatric: She has a normal mood and affect. Her behavior is normal.  Nursing note and vitals reviewed.  ED Course  Procedures (including critical care time) DIAGNOSTIC STUDIES: Oxygen Saturation is 100% on RA, normal by my interpretation.    COORDINATION OF CARE: 9:09 AM-Discussed treatment plan which includes XR with pt  at bedside and pt agreed to plan.   Labs Review Labs Reviewed  URINALYSIS, ROUTINE W REFLEX MICROSCOPIC (NOT AT [facility]) - Abnormal; Notable for the following:    APPearance HAZY (*)    All other components within normal limits  PREGNANCY, URINE   Imaging Review Dg Lumbar Spine Complete  03/26/2015   CLINICAL DATA:  Mid back pain for 2 days.  EXAM: LUMBAR SPINE -  COMPLETE 4+ VIEW  COMPARISON:  None.  FINDINGS: Alignment of the lumbar spine is normal. Disc spaces appear well preserved throughout. No osteophytes or other secondary signs of advanced degenerative change.  Bone mineralization is normal. No focal cortical irregularity or osseous lesion. No fracture line or displaced fracture fragment. Posterior elements appear grossly intact and well aligned throughout. Upper sacrum appears intact and well aligned.  Cholecystectomy clips noted in the right upper quadrant. Visualized paravertebral soft tissues are otherwise unremarkable.  IMPRESSION: Unremarkable plain film examination of the lumbar spine. No acute findings. No significant degenerative change seen.   Electronically Signed   By: Franki Cabot M.D.   On: 03/26/2015 09:59    EKG Interpretation None      MDM   Final diagnoses:  Low back pain  Midline low back pain without sciatica    Patient with back pain.  No neurological deficits and normal neuro exam.  Patient can walk but states is painful.  No loss of bowel or bladder control.  No concern for cauda equina.  No fever, night sweats, weight loss, h/o cancer, IVDU.  Radiographs unremarkable for acute pathology. UA unremarkable. Patient afebrile, hemodynamically stable and in no acute distress. Patient stable for discharge. RICE protocol and pain medicine indicated and discussed with patient. Patient states Norflex has helped her in the past, patient requesting prescription for Norflex. We'll fill this today. Strongly encouraged follow-up with PCP. Return precautions discussed, patient verbalizes understanding and agreement of this plan.  I personally performed the services described in this documentation, which was scribed in my presence. The recorded information has been reviewed and is accurate.  BP 129/83 mmHg  Pulse 72  Temp(Src) 97.5 F (36.4 C) (Oral)  Resp 16  Ht 4\' 9"  (1.448 m)  Wt 154 lb (69.854 kg)  BMI 33.32 kg/m2  SpO2  100%  Signed,  Dahlia Bailiff, PA-C 3:03 PM    Dahlia Bailiff, PA-C 03/26/15 1503  Elnora Morrison, MD 03/29/15 [phone]

## 2016-02-13 LAB — HM PAP SMEAR: HM Pap smear: NEGATIVE

## 2016-08-05 ENCOUNTER — Encounter (HOSPITAL_COMMUNITY): Payer: Self-pay | Admitting: Emergency Medicine

## 2017-05-04 ENCOUNTER — Ambulatory Visit (INDEPENDENT_AMBULATORY_CARE_PROVIDER_SITE_OTHER): Payer: 59 | Admitting: Family Medicine

## 2017-05-04 ENCOUNTER — Encounter: Payer: Self-pay | Admitting: Osteopathic Medicine

## 2017-05-04 ENCOUNTER — Ambulatory Visit (INDEPENDENT_AMBULATORY_CARE_PROVIDER_SITE_OTHER): Payer: 59 | Admitting: Osteopathic Medicine

## 2017-05-04 ENCOUNTER — Ambulatory Visit (INDEPENDENT_AMBULATORY_CARE_PROVIDER_SITE_OTHER): Payer: 59

## 2017-05-04 VITALS — BP 106/66 | HR 62 | Ht <= 58 in | Wt 157.0 lb

## 2017-05-04 DIAGNOSIS — R7309 Other abnormal glucose: Secondary | ICD-10-CM

## 2017-05-04 DIAGNOSIS — M25552 Pain in left hip: Secondary | ICD-10-CM

## 2017-05-04 DIAGNOSIS — M7062 Trochanteric bursitis, left hip: Secondary | ICD-10-CM

## 2017-05-04 DIAGNOSIS — R748 Abnormal levels of other serum enzymes: Secondary | ICD-10-CM | POA: Diagnosis not present

## 2017-05-04 LAB — CBC
HCT: 42.5 % (ref 35.0–45.0)
HEMOGLOBIN: 14.2 g/dL (ref 11.7–15.5)
MCH: 28.7 pg (ref 27.0–33.0)
MCHC: 33.4 g/dL (ref 32.0–36.0)
MCV: 85.9 fL (ref 80.0–100.0)
MPV: 11.4 fL (ref 7.5–12.5)
Platelets: 247 10*3/uL (ref 140–400)
RBC: 4.95 MIL/uL (ref 3.80–5.10)
RDW: 13.7 % (ref 11.0–15.0)
WBC: 6 10*3/uL (ref 3.8–10.8)

## 2017-05-04 NOTE — Progress Notes (Signed)
Subjective:    I'm seeing this patient as a consultation for:  Emeterio Reeve, DO   CC: Left Hip Pain  HPI: Patient notes a one-month history of left lateral hip pain. She denies any injury radiating pain weakness or numbness. She notes pain is located in the lateral aspect of the hip and is worse with standing from a seated position and walking. Additionally she notes pain when she lays on her left side. A week ago in urgent care he she received an intramuscular steroid injection which did not help. She's tried over-the-counter medicines for pain which have not helped at all. No fevers or chills.  Past medical history, Surgical history, Family history not pertinant except as noted below, Social history, Allergies, and medications have been entered into the medical record, reviewed, and no changes needed.   Review of Systems: No headache, visual changes, nausea, vomiting, diarrhea, constipation, dizziness, abdominal pain, skin rash, fevers, chills, night sweats, weight loss, swollen lymph nodes, body aches, joint swelling, muscle aches, chest pain, shortness of breath, mood changes, visual or auditory hallucinations.   Objective:   BP 106/66   Pulse 62   Ht 4\' 9"  (1.448 m)   Wt 157 lb (71.2 kg)   BMI 33.97 kg/m  General: Well Developed, well nourished, and in no acute distress.  Neuro/Psych: Alert and oriented x3, extra-ocular muscles intact, able to move all 4 extremities, sensation grossly intact. Skin: Warm and dry, no rashes noted.  Respiratory: Not using accessory muscles, speaking in full sentences, trachea midline.  Cardiovascular: Pulses palpable, no extremity edema. Abdomen: Does not appear distended. MSK:  Left hip normal-appearing normal motion Tender to palpation left lateral greater trochanter. Significantly weak to hip abduction 4/5. Antalgic gait.  Hip greater trochanteric injection: Left Consent obtained and timeout performed. Area of maximum tenderness  palpated and identified. Skin cleaned with alcohol, cold spray applied. A spinal needle was used to access the greater trochanteric bursa. 80 mg of Depo-Medrol, and 3 mL of Marcaine were used to inject the trochanteric bursa. Patient tolerated the procedure well.   No results found for this or any previous visit (from the past 24 hour(s)). Dg Hip Unilat W Or W/o Pelvis 2-3 Views Left  Result Date: 05/04/2017 CLINICAL DATA:  Left hip pain.  No known injury . EXAM: DG HIP (WITH OR WITHOUT PELVIS) 2-3V LEFT COMPARISON:  No recent prior . FINDINGS: Degenerative changes lumbar spine and both hips. No acute bony or joint abnormality identified. No evidence of fracture dislocation. Intramedullary rod noted in the right femur. The intramedullary rod is fractured. Pelvic calcifications consistent phleboliths. IMPRESSION: 1. No acute bony abnormality identified. No evidence of fracture. Degenerative changes noted lumbar spine and both hips. 2. Intramedullary rod noted in the right femur. intramedullary rod is fractured. Electronically Signed   By: Marcello Moores  Register   On: 05/04/2017 10:06    Impression and Recommendations:    Assessment and Plan: 56 y.o. female with Left lateral hip pain due to greater trochanteric bursitis. Plan to treat with injection as above as well as referral to physical therapy. Recheck in 6 weeks. Spanish interpreter used for today's visit..   Orders Placed This Encounter  Procedures  . Ambulatory referral to Physical Therapy    Referral Priority:   Routine    Referral Type:   Physical Medicine    Referral Reason:   Specialty Services Required    Requested Specialty:   Physical Therapy   No orders of the defined types  were placed in this encounter.   Discussed warning signs or symptoms. Please see discharge instructions. Patient expresses understanding.

## 2017-05-04 NOTE — Patient Instructions (Signed)
Cuidados preventivos en las mujeres de entre 82 y [phone] (Preventive Care 40-64 Years, Female) Los cuidados preventivos hacen referencia a las opciones en cuanto al estilo de vida y a las visitas al mdico, las cuales pueden promover la salud y Musician. QU INCLUYEN LOS CUIDADOS PREVENTIVOS?  Un examen fsico anual. Tambin se lo conoce como control de rutina anual.  Exmenes dentales una o dos veces al ao.  Exmenes oculares de [place]. Pregntele al mdico con qu frecuencia debe hacerse controles oculares.  Opciones personales en cuanto al estilo de vida, entre ellas: ? [place] y las piezas dentales. ? Realizar actividad fsica con regularidad. ? Consumir una dieta saludable. ? Evitar el consumo de tabaco y de drogas. ? Limitar el consumo de bebidas alcohlicas. ? Counsellor. ? Tomar aspirina de baja dosis diariamente a [employer]. ? Tomar los suplementos vitamnicos y [employer] se lo haya indicado el mdico.  QU SUCEDE DURANTE UN CONTROL DE [place]? Los servicios y las pruebas de deteccin que el mdico realiza durante un control de rutina anual dependern de la edad, el estado de salud general, los factores de riesgo relacionados con el estilo de vida y los antecedentes familiares de enfermedades. Psicoterapia El mdico puede hacerle preguntas sobre lo siguiente:  Consumo de alcohol.  Consumo de tabaco.  Consumo de drogas.  [place] familiar y en las relaciones.  Actividad sexual.  Hbitos de alimentacin.  Trabajo y entorno laboral.  Mtodos anticonceptivos.  El ciclo menstrual.  Antecedentes de embarazos. Pruebas de deteccin Pueden hacerle las siguientes pruebas o mediciones:  Estatura, peso e ndice de masa corporal [facility]).  La presin arterial.  Niveles de lpidos y colesterol. Estos se pueden controlar cada 5aos o con mayor frecuencia si tiene ms de  50aos.  Controles de la piel.  Pruebas de deteccin de cncer de pulmn. Pueden hacerle estas pruebas todos los aos a partir de los 55aos si ha fumado durante 30aos un paquete diario y sigue fumando o dej el hbito en algn momento en los ltimos 15aos.  Prueba de Personnel officer en las heces [facility]). Pueden hacerle esta prueba todos los aos a partir de [place].  Sigmoidoscopia flexible o colonoscopia. Pueden hacerle una sigmoidoscopia cada 22aos o una colonoscopia cada 10aos a partir de los 50aos.  Anlisis de sangre de hepatitisC.  Anlisis de sangre de hepatitisB.  Pruebas de deteccin de enfermedades de transmisin sexual (ETS).  Pruebas de deteccin de la diabetes. Para realizarlas, se controla el nivel de azcar en la sangre (glucemia) despus de que haya pasado un rato sin comer (ayuno). Pueden hacerle esta prueba cada 1 a 3aos.  Mamografa. Pueden hacerle este estudio cada 1 o 2aos. Hable con el mdico acerca de cundo debe comenzar a Nutritional therapist. Esto depende de si tiene antecedentes familiares de cncer de mama.  Pruebas de deteccin de tumores malignos relacionados con el BRCA. Se pueden realizar si tiene antecedentes familiares de cncer de mama, de ovarios, de trompas o de peritoneo.  Examen plvico y prueba de Papanicolaou. Se pueden realizar cada 3aos, a partir de los 21aos. A partir de los 30aos, se pueden realizar cada 5aos si le hacen una prueba de Papanicolaou en combinacin con un anlisis del virus del Engineer, technical sales (VPH).  Densitometra sea. Se realiza para detectar la presencia de osteoporosis. Pueden hacerle este estudio si corre un alto riesgo de tener osteoporosis. Hable con el [employer]  resultados [place] opciones de tratamiento y, si es necesario, la necesidad de Optometrist ms [employer]. Vacunas El mdico puede recomendarle que se aplique algunas vacunas, por ejemplo:  [place] antigripal. Se  recomienda su aplicacin todos los aos.  Vacuna contra la difteria, el ttanos y Research officer, trade union (Tdap, Td). Puede que tenga que aplicarse un refuerzo contra el ttanos y la difteria (Td) cada 10aos.  Vacuna contra la varicela. Es posible que tenga que aplicrsela si no recibi esta vacuna.  Vacuna contra el herpes zster. Tal vez deba aplicrsela despus de los 60aos.  Vacuna contra el sarampin, la rubola y las paperas ([place]). Tal vez deba aplicarse al menos una dosis de SRP si naci [phone] o despus de ese ao. Podra tambin necesitar una segunda dosis.  Vacuna antineumoccica conjugada 13valente (PCV13). Puede necesitar esta vacuna si tiene determinadas enfermedades y no se vacun anteriormente.  Vacuna antineumoccica de polisacridos (PPSV23). Quizs tenga que aplicarse una o dos dosis si fuma o si sufre determinadas enfermedades.  Vacuna antimeningoccica. Puede necesitar esta vacuna si tiene determinadas enfermedades.  Vacuna contra la hepatitis A. Es posible que tenga que aplicarse esta vacuna si tiene determinadas enfermedades, o si trabaja en lugares donde puede estar expuesta a la hepatitisA o viaja a estas zonas.  Vacuna contra la hepatitis B. Es posible que tenga que aplicarse esta vacuna si tiene determinadas enfermedades, o si trabaja en lugares donde puede estar expuesta a la hepatitisB o viaja a estas zonas.  Vacuna antihaemophilus influenzae tipoB (Hib). Puede necesitar esta vacuna si tiene determinadas enfermedades. Hable con el [place] deteccin y las vacunas que tiene que [place], y con qu frecuencia debe hacerlo. Esta informacin no tiene Marine scientist el consejo del mdico. Asegrese de hacerle al mdico cualquier pregunta que tenga. Document Reviewed: 06/24/2015 Elsevier Interactive Patient Education  2017 [employer].

## 2017-05-04 NOTE — Patient Instructions (Signed)
Thank you for coming in today. Call or go to the ER if you develop a large red swollen joint with extreme pain or oozing puss.    Bursitis en la cadera (Hip Bursitis) La bursitis en la cadera es la inflamacin de una bolsa llena de lquido (bolsa sinovial) en la articulacin de la cadera. La bolsa sinovial protege los huesos de la articulacin de la cadera de que se rocen [place] s. La bursitis en la cadera puede causar dolor leve a moderado, y los sntomas suelen aparecer y Armed forces operational officer a lo largo del [place]. CAUSAS Esta afeccin puede ser causada por lo siguiente:  Lesin en la cadera.  Uso excesivo de los msculos que rodean la articulacin de la cadera.  Artritis o gota.  Diabetes.  Enfermedad tiroidea.  El clima fro.  Infeccin. En algunos casos, es posible que la causa no se conozca. SNTOMAS Los sntomas de esta afeccin pueden incluir lo siguiente:  Dolor leve o moderado en la zona de la cadera. El dolor puede intensificarse con el movimiento.  Dolor con la palpacin e hinchazn de la cadera, especialmente del lado externo. Los sntomas pueden aparecer y Armed forces operational officer. Si la bolsa sinovial se infecta, tal vez tenga los siguientes sntomas:  Fiebre.  Enrojecimiento de la piel y sensacin de calor en la zona de la cadera. DIAGNSTICO Esta afeccin se puede diagnosticar en funcin de lo siguiente:  Un examen fsico.  Sus antecedentes mdicos.  Radiografas.  Extraccin de lquido de la bolsa sinovial inflamada para su anlisis (biopsia). Tal vez lo deriven a Immunologist (traumatlogo) o a Civil Service fast streamer que se especializa en inflamaciones articulares (reumatlogo). TRATAMIENTO El tratamiento de esta afeccin incluye hacer reposo, Lexicographer (elevar) la zona de la lesin y aplicar presin (compresin) en la zona. En algunos casos, esto puede ser suficiente para que los sntomas desaparezcan. El tratamiento tambin puede incluir lo  siguiente:  Muletas.  Antibiticos.  Extraccin de lquido de la bolsa sinovial para Human resources officer hinchazn.  Un medicamento inyectable que ayuda a reducir la inflamacin (cortisona). [place] los medicamentos de venta libre y los recetados solamente como se lo haya indicado el mdico.  No conduzca ni opere maquinaria pesada mientras toma analgsicos recetados, o como se lo haya indicado el mdico.  Si le recetaron un antibitico, tmelo como se lo haya indicado el mdico. No deje de tomar los antibiticos aunque comience a Sports administrator. Actividad  Reanude sus actividades normales como se lo haya indicado el mdico. Pregntele al mdico qu actividades son seguras para usted.  Haga reposo y protjase la cadera tanto como sea posible hasta que el dolor y la hinchazn mejoren. Instrucciones generales  Use las vendas de compresin solamente como se lo haya indicado el mdico.  Eleve la cadera por encima del nivel del corazn tanto como sea posible sin que Corporate treasurer. Para hacerlo, intente colocar una almohada debajo de las caderas mientras est [place].  No apoye el peso del cuerpo sobre la cadera hasta tanto el mdico lo autorice. Use muletas como se lo haya indicado el mdico.  [place] y estire suavemente la zona de la lesin tan frecuentemente como le resulte cmodo.  Concurra a todas las visitas de control como se lo haya indicado el mdico. Esto es importante. PREVENCIN  Haga actividad fsica habitualmente como se lo haya indicado el mdico.  Haga un precalentamiento y elongacin adecuados antes de la [place].  Reljese y elongue  despus de [employer].  Si una actividad le causa irritacin o dolor en la cadera, evtela tanto como sea posible.  Evite estar sentado [employer]. SOLICITE ATENCIN MDICA SI:  Shannon Schmitt.  Presenta nuevos sntomas.  Tiene dificultad para caminar o [employer] cotidianas.  Tiene un dolor que empeora o que no mejora con los medicamentos.  Tiene la piel enrojecida o sensacin de calor en la zona de la cadera. SOLICITE ATENCIN MDICA DE INMEDIATO SI:  No puede mover la cadera.  Siente dolor intenso. Esta informacin no tiene Marine scientist el consejo del mdico. Asegrese de hacerle al mdico cualquier pregunta que tenga. Document Released: 08/23/2005 Document Revised: 12/15/2015 Document Reviewed: 03/25/2015 Elsevier Interactive Patient Education  [employer].

## 2017-05-04 NOTE — Progress Notes (Signed)
HPI: Shannon Schmitt is a 55 y.o. female  who presents to [facility] today, 05/04/17,  for chief complaint of:  Chief Complaint  Patient presents with  . Establish Care    pain left side of leg    Pain in L Hip: ongoing for years, flared over past week or so. Seen UC last week, got an injection but no XR, nothing really helped. Points to greater trochanter and buttock as location of pain.   Seen by [facility] urgent care 04/28/17 - 6 days ago, Dx Bursitis bilateral hips, Tx Kenaolg IM and Tylenol/Motrin OTC, Norflex.   Patient is accompanied by husband who assists with history-taking.   Interpreter was used: [employer] ID [id number]  Records reviewed: looks like mildly elevated sugars, presumably fasting, no A1C. Reports Hx prediabetes. Elevated liver enzymes   Past medical, surgical, social and family history reviewed: Patient Active Problem List   Diagnosis Date Noted  . INTRINSIC ASTHMA, UNSPECIFIED 10/21/2010  . Acute asthmatic bronchitis 10/07/2010  . ALLERGIC RHINITIS 10/07/2010  . GERD, SEVERE 10/07/2010   Past Surgical History:  Procedure Laterality Date  . APPENDECTOMY    . TOTAL ABDOMINAL HYSTERECTOMY     Social History  Substance Use Topics  . Smoking status: Never Smoker  . Smokeless tobacco: Not on file  . Alcohol use No   No family history on file.   Current medication list and allergy/intolerance information reviewed:   Current Outpatient Prescriptions  Medication Sig Dispense Refill  . acetaminophen (TYLENOL) 500 MG tablet Take 1 tablet (500 mg total) by mouth every 6 (six) hours as needed. 30 tablet 0  . albuterol (PROVENTIL HFA) 108 (90 BASE) MCG/ACT inhaler Inhale 2 puffs into the lungs every 6 (six) hours as needed.      . lansoprazole (PREVACID) 30 MG capsule Take 30 mg by mouth daily.    . ondansetron (ZOFRAN ODT) 4 MG disintegrating tablet Take 1 tablet (4 mg total) by mouth every 8 (eight) hours  as needed for nausea or vomiting. 20 tablet 0  . orphenadrine (NORFLEX) 100 MG tablet Take 1 tablet (100 mg total) by mouth 2 (two) times daily. 20 tablet 0   No current facility-administered medications for this visit.    Allergies  Allergen Reactions  . Aspirin   . Codeine   . Penicillins   . Shellfish Allergy       Review of Systems:  Constitutional:  No  fever, no chills, No recent illness  HEENT: No  headache, no vision change,  Cardiac: No  chest pain, No  pressure  Respiratory:  No  shortness of breath. No  Cough  Gastrointestinal: No  abdominal pain, No  nausea, No  vomiting,  No  blood in stool, No  diarrhea, No  constipation   Musculoskeletal: +myalgia/arthralgia  Skin: No  Rash  Neurologic: No  weakness, No  dizziness,  Psychiatric: No  concerns with depression, No  concerns with anxiety, No sleep problems, No mood problems  Exam:  BP 106/66   Pulse 62   Ht 4\' 9"  (1.448 m)   Wt 157 lb (71.2 kg)   BMI 33.97 kg/m   Constitutional: VS see above. General Appearance: alert, well-developed, well-nourished, NAD  Eyes: Normal lids and conjunctive, non-icteric sclera  Ears, Nose, Mouth, Throat: MMM, Normal external inspection ears/nares/mouth/lips/gums.   Neck: No masses, trachea midline. No thyroid enlargement.   Respiratory: Normal respiratory effort. no wheeze, no rhonchi, no rales  Cardiovascular: S1/S2  normal, no murmur, no rub/gallop auscultated. RRR. No lower extremity edema.   Gastrointestinal: Nontender, no masses. No hepatomegaly, no splenomegaly.   Musculoskeletal: Gait normal. No clubbing/cyanosis of digits. (+)FABER L leg, (+)tenderness at greater trochanter L leg, Neg SLR on L  Neurological: Normal balance/coordination. No tremor.   Psychiatric: Normal judgment/insight. Normal mood and affect. Oriented x3.   Dg Hip Unilat W Or W/o Pelvis 2-3 Views Left  Result Date: 05/04/2017 CLINICAL DATA:  Left hip pain.  No known injury . EXAM: DG  HIP (WITH OR WITHOUT PELVIS) 2-3V LEFT COMPARISON:  No recent prior . FINDINGS: Degenerative changes lumbar spine and both hips. No acute bony or joint abnormality identified. No evidence of fracture dislocation. Intramedullary rod noted in the right femur. The intramedullary rod is fractured. Pelvic calcifications consistent phleboliths. IMPRESSION: 1. No acute bony abnormality identified. No evidence of fracture. Degenerative changes noted lumbar spine and both hips. 2. Intramedullary rod noted in the right femur. intramedullary rod is fractured. Electronically Signed   By: Marcello Moores  Register   On: 05/04/2017 10:06     ASSESSMENT/PLAN:   Left hip pain - see Sports Med - Dr Georgina Snell - note for full details of treatment of bursitis - Plan: DG HIP UNILAT W OR W/O PELVIS 2-3 VIEWS LEFT  Elevated glucose - Plan: CBC, COMPLETE METABOLIC PANEL WITH GFR, Lipid panel, TSH, Hemoglobin A1c  Elevated liver enzymes - Plan: COMPLETE METABOLIC PANEL WITH GFR, Lipid panel       Visit summary with medication list and pertinent instructions was printed for patient to review. All questions at time of visit were answered - patient instructed to contact office with any additional concerns. ER/RTC precautions were reviewed with the patient. Follow-up plan: Return for cuidados preventivos y [employer] de sus anlisis de [place].  Note: Total time spent 30 minutes, greater than 50% of the visit was spent face-to-face counseling and coordinating care for the following: The primary encounter diagnosis was Left hip pain. Diagnoses of Elevated glucose and Elevated liver enzymes were also pertinent to this visit.Marland Kitchen

## 2017-05-05 LAB — COMPLETE METABOLIC PANEL WITH GFR
ALBUMIN: 4.4 g/dL (ref 3.6–5.1)
ALK PHOS: 137 U/L — AB (ref 33–130)
ALT: 86 U/L — ABNORMAL HIGH (ref 6–29)
AST: 40 U/L — ABNORMAL HIGH (ref 10–35)
BUN: 14 mg/dL (ref 7–25)
CO2: 20 mmol/L (ref 20–32)
Calcium: 9.2 mg/dL (ref 8.6–10.4)
Chloride: 104 mmol/L (ref 98–110)
Creat: 0.6 mg/dL (ref 0.50–1.05)
GFR, Est African American: >89 mL/min (ref >=60)
GFR, Est Non African American: >89 mL/min (ref >=60)
Glucose, Bld: 124 mg/dL — ABNORMAL HIGH (ref 65–99)
Potassium: 4.6 mmol/L (ref 3.5–5.3)
Sodium: 139 mmol/L (ref 135–146)
Total Bilirubin: 1 mg/dL (ref 0.2–1.2)
Total Protein: 7.5 g/dL (ref 6.1–8.1)

## 2017-05-05 LAB — TSH: TSH: 1.73 mIU/L

## 2017-05-05 LAB — LIPID PANEL
Cholesterol: 237 mg/dL — ABNORMAL HIGH (ref <200)
HDL: 53 mg/dL (ref >50)
LDL CALC: 155 mg/dL — AB (ref <100)
Total CHOL/HDL Ratio: 4.5 Ratio (ref <5.0)
Triglycerides: 143 mg/dL (ref <150)
VLDL: 29 mg/dL (ref <30)

## 2017-05-05 LAB — HEMOGLOBIN A1C
HEMOGLOBIN A1C: 7.7 % — AB (ref <5.7)
Mean Plasma Glucose: 174 mg/dL

## 2017-05-11 ENCOUNTER — Ambulatory Visit: Payer: Self-pay | Admitting: Rehabilitative and Restorative Service Providers"

## 2017-05-11 ENCOUNTER — Encounter: Payer: Self-pay | Admitting: Osteopathic Medicine

## 2017-05-12 ENCOUNTER — Ambulatory Visit: Payer: 59 | Admitting: Rehabilitative and Restorative Service Providers"

## 2017-06-15 ENCOUNTER — Ambulatory Visit: Payer: Self-pay | Admitting: Family Medicine

## 2018-02-21 ENCOUNTER — Other Ambulatory Visit: Payer: Self-pay

## 2018-02-21 ENCOUNTER — Encounter: Payer: Self-pay | Admitting: *Deleted

## 2018-02-21 ENCOUNTER — Emergency Department (INDEPENDENT_AMBULATORY_CARE_PROVIDER_SITE_OTHER)
Admission: EM | Admit: 2018-02-21 | Discharge: 2018-02-21 | Disposition: A | Discharge status: Home / Self Care | Payer: Medicare Other | Source: Home / Self Care | Attending: Family Medicine | Admitting: Family Medicine

## 2018-02-21 DIAGNOSIS — J069 Acute upper respiratory infection, unspecified: Secondary | ICD-10-CM

## 2018-02-21 DIAGNOSIS — H6983 Other specified disorders of Eustachian tube, bilateral: Secondary | ICD-10-CM | POA: Diagnosis not present

## 2018-02-21 MED ORDER — DOXYCYCLINE HYCLATE 100 MG PO CAPS: 100.0000 mg | ORAL_CAPSULE | Freq: Two times a day (BID) | ORAL | 0 refills | Status: DC

## 2018-02-21 MED ORDER — PREDNISONE 20 MG PO TABS: ORAL_TABLET | ORAL | 0 refills | Status: DC

## 2018-02-21 NOTE — ED Provider Notes (Signed)
[facility]    CSN: [id number] Arrival date & time: 02/21/18  1839     History   Chief Complaint Chief Complaint  Patient presents with  . Otalgia    HPI Fey Coghill Keilyn Haggard is a 57 y.o. female.   Patient is Spanish speaking and presents with her husband who interprets.  She complains of bilateral neck and earache for about 5 days.  Her ears feel clogged.  No sore throat or cough.  No fevers, chills, and sweats.  She took erythromycin for 5 days without improvement.  The history is provided by the patient and the spouse. No language interpreter was used.    Past Medical History:  Diagnosis Date  . Allergic   . Asthma   . Diabetes mellitus without complication [facility])     Patient Active Problem List   Diagnosis Date Noted  . Elevated glucose 05/04/2017  . Elevated liver enzymes 05/04/2017  . INTRINSIC ASTHMA, UNSPECIFIED 10/21/2010  . Acute asthmatic bronchitis 10/07/2010  . ALLERGIC RHINITIS 10/07/2010  . GERD, SEVERE 10/07/2010    Past Surgical History:  Procedure Laterality Date  . APPENDECTOMY    . BLADDER SURGERY    . LEG SURGERY Right    Rod in right leg  . TOTAL ABDOMINAL HYSTERECTOMY      OB History   None      Home Medications    Prior to Admission medications   Medication Sig Start Date End Date Taking? Authorizing Provider  doxycycline (VIBRAMYCIN) 100 MG capsule Take 1 capsule (100 mg total) by mouth 2 (two) times daily. Take with food. 02/21/18   Kandra Nicolas, MD  metFORMIN (GLUCOPHAGE) 500 MG tablet Take by mouth.    [provider]  predniSONE (DELTASONE) 20 MG tablet Take one tab by mouth twice daily for 5 days. Take with food. 02/21/18   Kandra Nicolas, MD    Family History Family History  Problem Relation Age of Onset  . Diabetes Mother   . Heart disease Mother     Social History Social History   Tobacco Use  . Smoking status: Never Smoker  . Smokeless tobacco: Never Used  Substance Use  Topics  . Alcohol use: No  . Drug use: No     Allergies   Aspirin; Codeine; Penicillins; and Shellfish allergy   Review of Systems Review of Systems No sore throat No cough No pleuritic pain No wheezing + nasal congestion ? post-nasal drainage No sinus pain/pressure No itchy/red eyes + earache No hemoptysis No SOB No fever/chills No nausea No vomiting No abdominal pain No diarrhea No urinary symptoms No skin rash + fatigue No myalgias + headache    Physical Exam Triage Vital Signs ED Triage Vitals  Enc Vitals Group     BP      Pulse      Resp      Temp      Temp src      SpO2      Weight      Height      Head Circumference      Peak Flow      Pain Score      Pain Loc      Pain Edu?      Excl. in [place]?    No data found.  Updated Vital Signs BP 128/81 (BP Location: Right Arm)   Pulse 81   Temp (!) 97.5 F (36.4 C) (Oral)   Resp 18  Wt 159 lb (72.1 kg)   SpO2 95%   BMI 34.41 kg/m   Visual Acuity Right Eye Distance:   Left Eye Distance:   Bilateral Distance:    Right Eye Near:   Left Eye Near:    Bilateral Near:     Physical Exam Nursing notes and Vital Signs reviewed. Appearance:  Patient appears stated age, and in no acute distress Eyes:  Pupils are equal, round, and reactive to light and accomodation.  Extraocular movement is intact.  Conjunctivae are not inflamed  Ears:  Canals normal.  Tympanic membranes have decreased light reflex but otherwise appear normal.  Nose:  Congested turbinates.  No sinus tenderness.   Pharynx:  Normal Neck:  Supple.  Enlarged posterior/lateral nodes are palpated bilaterally, tender to palpation on the left.   Lungs:  Clear to auscultation.  Breath sounds are equal.  Moving air well. Heart:  Regular rate and rhythm without murmurs, rubs, or gallops.  Abdomen:  Nontender without masses or hepatosplenomegaly.  Bowel sounds are present.  No CVA or flank tenderness.  Extremities:  No edema.  Skin:  No rash  present.    UC Treatments / Results  Labs (all labs ordered are listed, but only abnormal results are displayed) Labs Reviewed -  Tympanometry:  Right ear tympanogram wide; Left ear tympanogram positive peak pressure  EKG None  Radiology No results found.  Procedures Procedures (including critical care time)  Medications Ordered in UC Medications - No data to display  Initial Impression / Assessment and Plan / UC Course  I have reviewed the triage vital signs and the nursing notes.  Pertinent labs & imaging results that were available during my care of the patient were reviewed by me and considered in my medical decision making (see chart for details).    Begin prednisone burst, and doxycycline.   Final Clinical Impressions(s) / UC Diagnoses   Final diagnoses:  Viral URI  Eustachian tube dysfunction, bilateral     Discharge Instructions     Take plain guaifenesin (1200mg  extended release tabs such as Mucinex) twice daily, with plenty of water, for cough and congestion.  May add Pseudoephedrine (30mg , one or two every 4 to 6 hours) for sinus congestion.  Get adequate rest.     Try warm salt water gargles for sore throat.  Stop all antihistamines for now, and other non-prescription cough/cold preparations.      ED Prescriptions    Medication Sig Dispense Auth. Provider   predniSONE (DELTASONE) 20 MG tablet Take one tab by mouth twice daily for 5 days. Take with food. 10 tablet Kandra Nicolas, MD   doxycycline (VIBRAMYCIN) 100 MG capsule Take 1 capsule (100 mg total) by mouth 2 (two) times daily. Take with food. 14 capsule Kandra Nicolas, MD         Kandra Nicolas, MD 02/22/18 1044

## 2018-02-21 NOTE — Discharge Instructions (Addendum)
Take plain guaifenesin (1200mg extended release tabs such as Mucinex) twice daily, with plenty of water, for cough and congestion.  May add Pseudoephedrine (30mg, one or two every 4 to 6 hours) for sinus congestion.  Get adequate rest.   °Try warm salt water gargles for sore throat.  °Stop all antihistamines for now, and other non-prescription cough/cold preparations. °  °

## 2018-02-21 NOTE — ED Triage Notes (Signed)
Pt c/o bilateral ear pain x 3 days; RT worse. Denies fever.

## 2018-03-23 DIAGNOSIS — J320 Chronic maxillary sinusitis: Secondary | ICD-10-CM | POA: Diagnosis not present

## 2018-03-23 DIAGNOSIS — K210 Gastro-esophageal reflux disease with esophagitis: Secondary | ICD-10-CM | POA: Diagnosis not present

## 2018-03-23 DIAGNOSIS — K1120 Sialoadenitis, unspecified: Secondary | ICD-10-CM | POA: Diagnosis not present

## 2018-03-23 DIAGNOSIS — J370 Chronic laryngitis: Secondary | ICD-10-CM | POA: Diagnosis not present

## 2018-03-23 DIAGNOSIS — J322 Chronic ethmoidal sinusitis: Secondary | ICD-10-CM | POA: Diagnosis not present

## 2018-05-22 DIAGNOSIS — J320 Chronic maxillary sinusitis: Secondary | ICD-10-CM | POA: Diagnosis not present

## 2018-05-22 DIAGNOSIS — R590 Localized enlarged lymph nodes: Secondary | ICD-10-CM | POA: Diagnosis not present

## 2018-05-22 DIAGNOSIS — J040 Acute laryngitis: Secondary | ICD-10-CM | POA: Diagnosis not present

## 2018-05-23 ENCOUNTER — Other Ambulatory Visit (HOSPITAL_COMMUNITY): Payer: Self-pay | Admitting: Otolaryngology

## 2018-05-23 DIAGNOSIS — R590 Localized enlarged lymph nodes: Secondary | ICD-10-CM

## 2018-05-23 DIAGNOSIS — R221 Localized swelling, mass and lump, neck: Secondary | ICD-10-CM

## 2018-05-23 DIAGNOSIS — J040 Acute laryngitis: Secondary | ICD-10-CM

## 2018-05-23 DIAGNOSIS — J320 Chronic maxillary sinusitis: Secondary | ICD-10-CM

## 2018-05-29 ENCOUNTER — Ambulatory Visit (HOSPITAL_COMMUNITY)
Admission: RE | Admit: 2018-05-29 | Discharge: 2018-05-29 | Disposition: A | Discharge status: Home / Self Care | Payer: Medicare Other | Source: Ambulatory Visit | Attending: Otolaryngology | Admitting: Otolaryngology

## 2018-05-29 DIAGNOSIS — M542 Cervicalgia: Secondary | ICD-10-CM | POA: Diagnosis not present

## 2018-05-29 DIAGNOSIS — R590 Localized enlarged lymph nodes: Secondary | ICD-10-CM

## 2018-05-29 DIAGNOSIS — J320 Chronic maxillary sinusitis: Secondary | ICD-10-CM

## 2018-05-29 DIAGNOSIS — R221 Localized swelling, mass and lump, neck: Secondary | ICD-10-CM | POA: Diagnosis present

## 2018-05-29 DIAGNOSIS — J040 Acute laryngitis: Secondary | ICD-10-CM

## 2018-06-20 ENCOUNTER — Ambulatory Visit (INDEPENDENT_AMBULATORY_CARE_PROVIDER_SITE_OTHER)
Admission: RE | Admit: 2018-06-20 | Discharge: 2018-06-20 | Disposition: A | Discharge status: Home / Self Care | Payer: BLUE CROSS/BLUE SHIELD | Source: Ambulatory Visit | Attending: Pulmonary Disease | Admitting: Pulmonary Disease

## 2018-06-20 ENCOUNTER — Encounter: Payer: Self-pay | Admitting: Pulmonary Disease

## 2018-06-20 ENCOUNTER — Other Ambulatory Visit (INDEPENDENT_AMBULATORY_CARE_PROVIDER_SITE_OTHER): Payer: BLUE CROSS/BLUE SHIELD

## 2018-06-20 ENCOUNTER — Ambulatory Visit (INDEPENDENT_AMBULATORY_CARE_PROVIDER_SITE_OTHER): Payer: BLUE CROSS/BLUE SHIELD | Admitting: Pulmonary Disease

## 2018-06-20 VITALS — BP 110/62 | HR 57 | Ht <= 58 in | Wt 145.0 lb

## 2018-06-20 DIAGNOSIS — J45909 Unspecified asthma, uncomplicated: Secondary | ICD-10-CM

## 2018-06-20 DIAGNOSIS — R0602 Shortness of breath: Secondary | ICD-10-CM

## 2018-06-20 LAB — CBC WITH DIFFERENTIAL/PLATELET
Basophils Absolute: 0 10*3/uL (ref 0.0–0.1)
Basophils Relative: 0.4 % (ref 0.0–3.0)
Eosinophils Absolute: 0.1 10*3/uL (ref 0.0–0.7)
Eosinophils Relative: 0.9 % (ref 0.0–5.0)
HCT: 39.5 % (ref 36.0–46.0)
Hemoglobin: 13.4 g/dL (ref 12.0–15.0)
LYMPHS ABS: 2.6 10*3/uL (ref 0.7–4.0)
Lymphocytes Relative: 35.6 % (ref 12.0–46.0)
MCHC: 33.9 g/dL (ref 30.0–36.0)
MCV: 86.5 fl (ref 78.0–100.0)
Monocytes Absolute: 0.4 10*3/uL (ref 0.1–1.0)
Monocytes Relative: 5.1 % (ref 3.0–12.0)
NEUTROS PCT: 58 % (ref 43.0–77.0)
Neutro Abs: 4.2 10*3/uL (ref 1.4–7.7)
Platelets: 234 10*3/uL (ref 150.0–400.0)
RBC: 4.57 Mil/uL (ref 3.87–5.11)
RDW: 14.3 % (ref 11.5–15.5)
WBC: 7.3 10*3/uL (ref 4.0–10.5)

## 2018-06-20 LAB — C-REACTIVE PROTEIN: CRP: 0.7 mg/dL (ref 0.5–20.0)

## 2018-06-20 MED ORDER — PREDNISONE 20 MG PO TABS: ORAL_TABLET | ORAL | 0 refills | Status: DC

## 2018-06-20 MED ORDER — ALBUTEROL SULFATE HFA 108 (90 BASE) MCG/ACT IN AERS: 2.0000 | INHALATION_SPRAY | Freq: Four times a day (QID) | RESPIRATORY_TRACT | 2 refills | Status: AC | PRN

## 2018-06-20 MED ORDER — FAMOTIDINE 20 MG PO TABS: 20.0000 mg | ORAL_TABLET | Freq: Every day | ORAL | 0 refills | Status: DC

## 2018-06-20 MED ORDER — ALBUTEROL SULFATE (2.5 MG/3ML) 0.083% IN NEBU: 2.5000 mg | INHALATION_SOLUTION | Freq: Four times a day (QID) | RESPIRATORY_TRACT | 12 refills | Status: AC | PRN

## 2018-06-20 MED ORDER — BUDESONIDE-FORMOTEROL FUMARATE 160-4.5 MCG/ACT IN AERO: 2.0000 | INHALATION_SPRAY | Freq: Two times a day (BID) | RESPIRATORY_TRACT | 6 refills | Status: DC

## 2018-06-20 NOTE — Progress Notes (Signed)
Shannon Schmitt    [id number]    September 29, 1960  Primary Care Physician:Alexander, Lanelle Bal, DO  Referring Physician: Emeterio Reeve, [address] [place], [place] [address]  Chief complaint: Consult for dyspnea  HPI: 57 year old with history of allergies, asthma, diabetes, GERD Last seen in pulmonary clinic in 2014 for asthma, recurrent bronchitis, severe GERD.  She is instructed to stay on controller medication but has not done so.  Returns to clinic today with recurrent episodes of cough with yellow mucus, dyspnea, wheezing.  This happens whenever the weather changes. She is treating herself with intermittent rounds of antibiotic, prednisone, albuterol which she obtains without prescription from [place]. She has seasonal allergies, severe acid reflux and is on Prilosec 40 twice daily.  Even with this dose she has ongoing symptoms of heartburn.  Pets: 2 dogs, no cats, birds, farm animals Occupation: Used to work as a Insurance account manager, currently on disability. Exposures: No known exposures, no mold, hot tub, Jacuzzi Smoking history: Never smoker Travel history: Immigrated from [place] 2012.  No significant travel Relevant family history: No significant family history of lung disease.  Outpatient Encounter Medications as of 06/20/2018  Medication Sig  . albuterol (ACCUNEB) 1.25 MG/3ML nebulizer solution Take 1 ampule by nebulization every 6 (six) hours as needed for wheezing.  Marland Kitchen dexamethasone (DECADRON) 0.5 MG/5ML solution Take by mouth daily.  . metFORMIN (GLUCOPHAGE) 500 MG tablet Take by mouth.  . [DISCONTINUED] doxycycline (VIBRAMYCIN) 100 MG capsule Take 1 capsule (100 mg total) by mouth 2 (two) times daily. Take with food.  . [DISCONTINUED] predniSONE (DELTASONE) 20 MG tablet Take one tab by mouth twice daily for 5 days. Take with food.   No facility-administered encounter medications on file as of 06/20/2018.     Allergies  as of 06/20/2018 - Review Complete 06/20/2018  Allergen Reaction Noted  . Aspirin    . Codeine    . Penicillins    . Shellfish allergy      Past Medical History:  Diagnosis Date  . Allergic   . Asthma   . Diabetes mellitus without complication [facility])     Past Surgical History:  Procedure Laterality Date  . APPENDECTOMY    . BLADDER SURGERY    . LEG SURGERY Right    Rod in right leg  . TOTAL ABDOMINAL HYSTERECTOMY      Family History  Problem Relation Age of Onset  . Diabetes Mother   . Heart disease Mother     Social History   Socioeconomic History  . Marital status: Married    Spouse name: Not on file  . Number of children: N  . Years of education: Not on file  . Highest education level: Not on file  Occupational History  . Not on file  Social Needs  . Financial resource strain: Not on file  . Food insecurity:    Worry: Not on file    Inability: Not on file  . Transportation needs:    Medical: Not on file    Non-medical: Not on file  Tobacco Use  . Smoking status: Never Smoker  . Smokeless tobacco: Never Used  Substance and Sexual Activity  . Alcohol use: No  . Drug use: No  . Sexual activity: Not on file  Lifestyle  . Physical activity:    Days per week: Not on file    Minutes per session: Not on file  . Stress: Not  on file  Relationships  . Social connections:    Talks on phone: Not on file    Gets together: Not on file    Attends religious service: Not on file    Active member of club or organization: Not on file    Attends meetings of clubs or organizations: Not on file    Relationship status: Not on file  . Intimate partner violence:    Fear of current or ex partner: Not on file    Emotionally abused: Not on file    Physically abused: Not on file    Forced sexual activity: Not on file  Other Topics Concern  . Not on file  Social History Narrative   ** Merged History Encounter **       Moved here recently from [place].          Review of systems: Review of Systems  Constitutional: Negative for fever and chills.  HENT: Negative.   Eyes: Negative for blurred vision.  Respiratory: as per HPI  Cardiovascular: Negative for chest pain and palpitations.  Gastrointestinal: Negative for vomiting, diarrhea, blood per rectum. Genitourinary: Negative for dysuria, urgency, frequency and hematuria.  Musculoskeletal: Negative for myalgias, back pain and joint pain.  Skin: Negative for itching and rash.  Neurological: Negative for dizziness, tremors, focal weakness, seizures and loss of consciousness.  Endo/Heme/Allergies: Negative for environmental allergies.  Psychiatric/Behavioral: Negative for depression, suicidal ideas and hallucinations.  All other systems reviewed and are negative.  Physical Exam: Blood pressure 110/62, pulse (!) 57, height 4\' 9"  (1.448 m), weight 145 lb (65.8 kg), SpO2 97 %. Gen:      No acute distress HEENT:  EOMI, sclera anicteric Neck:     No masses; no thyromegaly Lungs:    Clear to auscultation bilaterally; normal respiratory effort CV:         Regular rate and rhythm; no murmurs Abd:      + bowel sounds; soft, non-tender; no palpable masses, no distension Ext:    No edema; adequate peripheral perfusion Skin:      Warm and dry; no rash Neuro: alert and oriented x 3 Psych: normal mood and affect  Data Reviewed: Imaging: Chest x-ray 10/07/2010-no active cardio pulmonary disease  PFTs: Pending  FENO 06/20/2018-unable to complete  Labs: Pending  Assessment:  Moderate persistent asthma Not on controller medication.  Seen today after a 5-year interval in pulmonary clinic with acute exacerbation We will treat with prednisone for 5 days.  Avoid prolonged course of prednisone as she has diabetes with elevated blood sugars Start Symbicort 160, continue albuterol nebulizers as needed inhaler.  Give albuterol rescue inhaler  Get chest x-ray, CBC differential, IgE and PFTs for baseline  evaluation  Severe GERD Reflux is contributing to ongoing symptoms Continue Prilosec twice daily Add Pepcid 20 mg at night. If she continues to have uncontrolled symptoms then consider GI eval.  Plan/Recommendations: - Prednisone 40 mg a day for 5 days - Start Symbicort, continue albuterol as needed - Chest x-ray, CBC, IgE, PFTs - Continue Prilosec, add Pepcid 20 mg at night.  Marshell Garfinkel MD [facility] 06/20/2018, 9:22 AM  CC: Emeterio Reeve, DO

## 2018-06-20 NOTE — Patient Instructions (Addendum)
We will get a chest x-ray, CBC differential, IgE and CRP today  We will give a prescription for Symbicort 160, continue albuterol nebulizer as needed.  Will give a prescription for the albuterol  We will give prednisone 40 mg a day for 5 days.  Please monitor your sugars while on this medication Add prilosec 20mg  at night for acid reflux Follow-up in 2 to 4 weeks with full pulmonary function test.

## 2018-06-21 LAB — IGE: IgE (Immunoglobulin E), Serum: 692 kU/L — ABNORMAL HIGH (ref <=114)

## 2018-07-03 ENCOUNTER — Ambulatory Visit (INDEPENDENT_AMBULATORY_CARE_PROVIDER_SITE_OTHER): Payer: BLUE CROSS/BLUE SHIELD | Admitting: Pulmonary Disease

## 2018-07-03 DIAGNOSIS — R0602 Shortness of breath: Secondary | ICD-10-CM

## 2018-07-03 DIAGNOSIS — J45909 Unspecified asthma, uncomplicated: Secondary | ICD-10-CM

## 2018-07-03 LAB — PULMONARY FUNCTION TEST
DL/VA % pred: 114 %
DL/VA: 4.36 ml/min/mmHg/L
DLCO COR % PRED: 105 %
DLCO UNC % PRED: 105 %
DLCO UNC: 16.33 ml/min/mmHg
DLCO cor: 16.33 ml/min/mmHg
FEF 25-75 POST: 3.59 L/s
FEF 25-75 Pre: 3.25 L/sec
FEF2575-%Change-Post: 10 %
FEF2575-%Pred-Post: 165 %
FEF2575-%Pred-Pre: 150 %
FEV1-%Change-Post: 1 %
FEV1-%PRED-POST: 107 %
FEV1-%Pred-Pre: 105 %
FEV1-Post: 2.23 L
FEV1-Pre: 2.19 L
FEV1FVC-%CHANGE-POST: 3 %
FEV1FVC-%Pred-Pre: 111 %
FEV6-%CHANGE-POST: -1 %
FEV6-%PRED-PRE: 96 %
FEV6-%Pred-Post: 95 %
FEV6-POST: 2.44 L
FEV6-PRE: 2.49 L
FEV6FVC-%PRED-POST: 103 %
FEV6FVC-%PRED-PRE: 103 %
FVC-%CHANGE-POST: -1 %
FVC-%Pred-Post: 91 %
FVC-%Pred-Pre: 93 %
FVC-POST: 2.44 L
FVC-Pre: 2.49 L
POST FEV6/FVC RATIO: 100 %
PRE FEV1/FVC RATIO: 88 %
Post FEV1/FVC ratio: 91 %
Pre FEV6/FVC Ratio: 100 %
RV % pred: 83 %
RV: 1.32 L
TLC % PRED: 92 %
TLC: 3.79 L

## 2018-07-03 NOTE — Progress Notes (Signed)
PFT done today. 

## 2018-07-06 ENCOUNTER — Ambulatory Visit: Payer: Self-pay | Admitting: Pulmonary Disease

## 2018-07-10 ENCOUNTER — Telehealth: Payer: Self-pay

## 2018-07-10 NOTE — Telephone Encounter (Signed)
Attempted to call patient today regarding results. I have left a voicemail message for pt to return call. X3 We have left three messages for pt to call our office back regarding results. No response at this time, a letter has been placed in mail today for pt to call our office. Mailed letter of communication today. Nothing further needed at this time.    

## 2018-11-16 ENCOUNTER — Encounter: Payer: Self-pay | Admitting: Gastroenterology

## 2018-11-27 ENCOUNTER — Ambulatory Visit: Payer: Self-pay | Admitting: Gastroenterology

## 2018-11-28 ENCOUNTER — Ambulatory Visit: Payer: Self-pay | Admitting: Physician Assistant

## 2019-03-05 ENCOUNTER — Encounter: Payer: Self-pay | Admitting: Gastroenterology

## 2019-03-20 ENCOUNTER — Other Ambulatory Visit: Payer: Self-pay

## 2019-03-20 ENCOUNTER — Ambulatory Visit (INDEPENDENT_AMBULATORY_CARE_PROVIDER_SITE_OTHER): Payer: Medicare Other | Admitting: Gastroenterology

## 2019-03-20 ENCOUNTER — Other Ambulatory Visit (INDEPENDENT_AMBULATORY_CARE_PROVIDER_SITE_OTHER): Payer: 59

## 2019-03-20 ENCOUNTER — Encounter: Payer: Self-pay | Admitting: Gastroenterology

## 2019-03-20 VITALS — BP 112/74 | HR 72 | Temp 98.1°F | Ht <= 58 in | Wt 144.5 lb

## 2019-03-20 DIAGNOSIS — K219 Gastro-esophageal reflux disease without esophagitis: Secondary | ICD-10-CM | POA: Diagnosis not present

## 2019-03-20 DIAGNOSIS — R1013 Epigastric pain: Secondary | ICD-10-CM | POA: Diagnosis not present

## 2019-03-20 DIAGNOSIS — K625 Hemorrhage of anus and rectum: Secondary | ICD-10-CM | POA: Diagnosis not present

## 2019-03-20 LAB — CBC WITH DIFFERENTIAL/PLATELET
Basophils Absolute: 0 10*3/uL (ref 0.0–0.1)
Basophils Relative: 0.7 % (ref 0.0–3.0)
Eosinophils Absolute: 0.1 10*3/uL (ref 0.0–0.7)
Eosinophils Relative: 1.5 % (ref 0.0–5.0)
HCT: 42.5 % (ref 36.0–46.0)
Hemoglobin: 14.1 g/dL (ref 12.0–15.0)
Lymphocytes Relative: 27.5 % (ref 12.0–46.0)
Lymphs Abs: 2 10*3/uL (ref 0.7–4.0)
MCHC: 33.1 g/dL (ref 30.0–36.0)
MCV: 85.4 fl (ref 78.0–100.0)
Monocytes Absolute: 0.5 10*3/uL (ref 0.1–1.0)
Monocytes Relative: 6.5 % (ref 3.0–12.0)
Neutro Abs: 4.7 10*3/uL (ref 1.4–7.7)
Neutrophils Relative %: 63.8 % (ref 43.0–77.0)
Platelets: 233 10*3/uL (ref 150.0–400.0)
RBC: 4.98 Mil/uL (ref 3.87–5.11)
RDW: 13.2 % (ref 11.5–15.5)
WBC: 7.4 10*3/uL (ref 4.0–10.5)

## 2019-03-20 MED ORDER — NA SULFATE-K SULFATE-MG SULF 17.5-3.13-1.6 GM/177ML PO SOLN: ORAL | 0 refills | Status: DC

## 2019-03-20 MED ORDER — LANSOPRAZOLE 30 MG PO CPDR: 30.0000 mg | DELAYED_RELEASE_CAPSULE | Freq: Two times a day (BID) | ORAL | 3 refills | Status: AC

## 2019-03-20 NOTE — Patient Instructions (Signed)
If you are age 57 or older, your body mass index should be between 23-30. Your Body mass index is 31.27 kg/m. If this is out of the aforementioned range listed, please consider follow up with your Primary Care Provider.  If you are age 9 or younger, your body mass index should be between 19-25. Your Body mass index is 31.27 kg/m. If this is out of the aformentioned range listed, please consider follow up with your Primary Care Provider.   You have been scheduled for an endoscopy and colonoscopy. Please follow the written instructions given to you at your visit today. Please pick up your prep supplies at the pharmacy within the next 1-3 days. If you use inhalers (even only as needed), please bring them with you on the day of your procedure. Your physician has requested that you go to www.startemmi.com and enter the access code given to you at your visit today. This web site gives a general overview about your procedure. However, you should still follow specific instructions given to you by our office regarding your preparation for the procedure.  We have sent the following medications to your pharmacy for you to pick up at your convenience: [place] provider has requested that you go to the basement level for lab work before leaving today. Press "B" on the elevator. The lab is located at the first door on the left as you exit the elevator. CBC w/Diff  Thank you for choosing me and [facility].   Alonza Bogus, PA-C

## 2019-03-20 NOTE — Progress Notes (Addendum)
03/20/2019 Shannon Schmitt [id number] May 24, 1961   HISTORY OF PRESENT ILLNESS: This is a 58 year old female who is new to our practice.  She has been referred here by her PCP, Dr. Sheppard Schmitt, in order to discuss rectal bleeding and reflux.  She tells me that the rectal bleeding has been occurring with every bowel movement for the past 2 to 3 months.  It is described as bright red blood in the toilet and on the paper.  She tells me she had a colonoscopy about 7 years ago in [place] and was found to have 3 polyps that were removed.  She denies constipation or straining.  She also describes reflux and epigastric abdominal pain.  Also reports nausea.  She has had reflux for quite some time and has been on Prevacid 30 mg daily.  She had an EGD about 7 years ago in [place] also.  The symptoms of nausea and epigastric abdominal pain began again a couple of weeks ago.  She says that hurts with everything that she eats.  Does not recall ever being diagnosed with H. pylori.  Denies NSAID use.  It appears that in November 2019 she had a barium esophagram performed for complaints of dysphasia and that showed some esophageal dysmotility.   Past Medical History:  Diagnosis Date  . Allergic   . Asthma   . Colon polyps   . Diabetes mellitus without complication ([place])   . Fibromyalgia    Past Surgical History:  Procedure Laterality Date  . CHOLECYSTECTOMY    . FEMUR FRACTURE SURGERY Right    with rod  . TONSILLECTOMY    . TOTAL ABDOMINAL HYSTERECTOMY      reports that she has never smoked. She has never used smokeless tobacco. She reports that she does not drink alcohol or use drugs. family history includes Breast cancer in her mother; Colon polyps in her father; Diabetes in her mother; Heart disease in her mother; Stroke in her mother. Allergies  Allergen Reactions  . Penicillins Swelling  . Aspirin   . Avelox [Moxifloxacin Hcl]   . Codeine   . Shellfish Allergy        Outpatient Encounter Medications as of 03/20/2019  Medication Sig  . albuterol (ACCUNEB) 1.25 MG/3ML nebulizer solution Take 1 ampule by nebulization every 6 (six) hours as needed for wheezing.  Marland Kitchen albuterol (PROVENTIL HFA;VENTOLIN HFA) 108 (90 Base) MCG/ACT inhaler Inhale 2 puffs into the lungs every 6 (six) hours as needed for wheezing or shortness of breath.  Marland Kitchen albuterol (PROVENTIL) (2.5 MG/3ML) 0.083% nebulizer solution Take 3 mLs (2.5 mg total) by nebulization every 6 (six) hours as needed for wheezing or shortness of breath.  . budesonide-formoterol (SYMBICORT) 160-4.5 MCG/ACT inhaler Inhale 2 puffs into the lungs 2 (two) times daily.  Marland Kitchen dexamethasone (DECADRON) 0.5 MG/5ML solution Take by mouth daily.  . lansoprazole (PREVACID) 30 MG capsule Take 1 tablet by mouth daily.  . QUEtiapine (SEROQUEL) 25 MG tablet Take 1 tablet by mouth as needed.  . sitaGLIPtin (JANUVIA) 100 MG tablet Take 1 tablet by mouth daily.  Marland Kitchen zolpidem (AMBIEN) 10 MG tablet Take 1 tablet by mouth daily as needed.  . [DISCONTINUED] famotidine (PEPCID) 20 MG tablet Take 1 tablet (20 mg total) by mouth at bedtime.  . [DISCONTINUED] metFORMIN (GLUCOPHAGE) 500 MG tablet Take by mouth.  . [DISCONTINUED] predniSONE (DELTASONE) 20 MG tablet 2 tabs (40mg ) daily for 5 days   No facility-administered encounter medications on file as of  03/20/2019.      REVIEW OF SYSTEMS  : All other systems reviewed and negative except where noted in the History of Present Illness.   PHYSICAL EXAM: BP 112/74 (BP Location: Left Arm, Patient Position: Sitting, Cuff Size: Normal)   Pulse 72   Temp 98.1 F (36.7 C)   Ht 4\' 9"  (1.448 m) Comment: ht measured without shoes  Wt 144 lb 8 oz (65.5 kg)   BMI 31.27 kg/m  General: Well developed female in no acute distress Head: Normocephalic and atraumatic Eyes:  Sclerae anicteric, conjunctiva pink. Ears: Normal auditory acuity Lungs: Clear throughout to auscultation; no increased WOB. Heart:  Regular rate and rhythm; no M/R/G. Abdomen: Soft, non-distended.  BS present.  Mild epigastric TTP. Musculoskeletal: Symmetrical with no gross deformities  Skin: No lesions on visible extremities Extremities: No edema  Neurological: Alert oriented x 4, grossly non-focal Psychological:  Alert and cooperative. Normal mood and affect  ASSESSMENT AND PLAN: *Rectal bleeding: Occurring with every bowel movement for the past 2 to 3 months.  Reports history of colonoscopy 7 years ago in [place] with polyps removed.  Will schedule for colonoscopy with Dr. Bryan Schmitt at patient's request.  Will check CBC today. *Epigastric pain and GERD: Also with nausea.  The symptoms have been present for the past couple of weeks.  Is on Prevacid 30 mg daily.  Will increase Prevacid to twice daily and schedule for EGD as well.  Reports having EGD 7 years ago in [place] also.  **Patient is primarily Spanish-speaking so the entire visit was performed via interpreter. **The risks, benefits, and alternatives to EGD and colonoscopy were discussed with the patient and she consents to proceed.  We were able to schedule her procedures for next week.  CC:  Shannon Reeve, DO   Agree with Shannon Schmitt's assessment and plan. Plan for EGD/Colonoscopy with me next week.   Shannon Cirigliano, DO, [facility]

## 2019-03-23 DIAGNOSIS — K625 Hemorrhage of anus and rectum: Secondary | ICD-10-CM | POA: Insufficient documentation

## 2019-03-23 DIAGNOSIS — R1013 Epigastric pain: Secondary | ICD-10-CM | POA: Insufficient documentation

## 2019-03-23 NOTE — Addendum Note (Signed)
Addended by: Lavena Bullion on: 03/23/2019 02:00 PM   Modules accepted: Level of Service

## 2019-03-27 ENCOUNTER — Telehealth: Payer: Self-pay | Admitting: Gastroenterology

## 2019-03-27 NOTE — Telephone Encounter (Signed)

## 2019-03-28 ENCOUNTER — Other Ambulatory Visit: Payer: Self-pay

## 2019-03-28 ENCOUNTER — Encounter: Payer: Self-pay | Admitting: Gastroenterology

## 2019-03-28 ENCOUNTER — Ambulatory Visit (AMBULATORY_SURGERY_CENTER): Payer: 59 | Admitting: Gastroenterology

## 2019-03-28 VITALS — BP 133/84 | HR 68 | Temp 98.3°F | Resp 24 | Ht 59.0 in | Wt 144.0 lb

## 2019-03-28 DIAGNOSIS — K635 Polyp of colon: Secondary | ICD-10-CM

## 2019-03-28 DIAGNOSIS — K317 Polyp of stomach and duodenum: Secondary | ICD-10-CM

## 2019-03-28 DIAGNOSIS — K641 Second degree hemorrhoids: Secondary | ICD-10-CM

## 2019-03-28 DIAGNOSIS — K644 Residual hemorrhoidal skin tags: Secondary | ICD-10-CM | POA: Diagnosis not present

## 2019-03-28 DIAGNOSIS — R1013 Epigastric pain: Secondary | ICD-10-CM

## 2019-03-28 DIAGNOSIS — D120 Benign neoplasm of cecum: Secondary | ICD-10-CM

## 2019-03-28 DIAGNOSIS — D125 Benign neoplasm of sigmoid colon: Secondary | ICD-10-CM

## 2019-03-28 DIAGNOSIS — K625 Hemorrhage of anus and rectum: Secondary | ICD-10-CM

## 2019-03-28 MED ORDER — SODIUM CHLORIDE 0.9 % IV SOLN: 500.0000 mL | Freq: Once | INTRAVENOUS | Status: DC

## 2019-03-28 NOTE — Progress Notes (Signed)
Pt's states no medical or surgical changes since previsit or office visit. 

## 2019-03-28 NOTE — Patient Instructions (Signed)
Please read handouts provided. Continue present medications. Await pathology results. Use fiber, for example Citrucel, Fibercon, Konsyl, or Metamucil. Return to GI clinic as needed.   USTED TUVO UN PROCEDIMIENTO ENDOSCPICO HOY EN EL [facility]:   Lea el informe del procedimiento que se le entreg para cualquier pregunta especfica sobre lo que se Primary school teacher.  Si el informe del examen no responde a sus preguntas, por favor llame a su gastroenterlogo para aclararlo.  Si usted solicit que no se le den [employer] de lo que se Estate manager/land agent en su procedimiento al [employer] va a cuidar, entonces el informe del procedimiento se ha incluido en un sobre sellado para que usted lo revise despus cuando le sea ms conveniente.   LO QUE PUEDE ESPERAR: Algunas sensaciones de hinchazn en el abdomen.  Puede tener ms gases de lo normal.  El caminar puede ayudarle a eliminar el aire que se le puso en el tracto gastrointestinal durante el procedimiento y reducir la hinchazn.  Si le hicieron una endoscopia inferior (como una colonoscopia o una sigmoidoscopia flexible), podra notar manchas de sangre en las heces fecales o en el papel higinico.  Si se someti a una preparacin intestinal para su procedimiento, es posible que no tenga una evacuacin intestinal normal durante [employer].   Tenga en cuenta:  Es posible que note un poco de irritacin y congestin en la nariz o algn drenaje.  Esto es debido al oxgeno [employer] durante su procedimiento.  No hay que preocuparse y esto debe desaparecer ms o Scientist, research (medical).   SNTOMAS PARA REPORTAR INMEDIATAMENTE:  Despus de una endoscopia inferior (colonoscopia o sigmoidoscopia flexible):  Cantidades excesivas de sangre en las heces fecales  Sensibilidad significativa o empeoramiento de los dolores abdominales   Hinchazn aguda del abdomen que antes no tena   Fiebre de 100F o ms   Despus de la endoscopia superior (EGD)  Vmitos de Biochemist, clinical o material como caf molido   Dolor en el pecho o dolor debajo de los omplatos que antes no tena   Dolor o dificultad persistente para tragar  Falta de aire que antes no tena   Fiebre de 100F o ms  Heces fecales negras y pegajosas   Para asuntos urgentes o de Freight forwarder, puede comunicarse con un gastroenterlogo a cualquier hora llamando al [phone].  DIETA:  Recomendamos una comida pequea al principio, pero luego puede continuar con su dieta normal.  Tome muchos lquidos, Teacher, adult education las bebidas alcohlicas durante 24 horas.    ACTIVIDAD:  Debe planear tomarse las cosas con calma por el resto del da y no debe CONDUCIR ni usar maquinaria pesada Programmer, applications (debido a los medicamentos de sedacin utilizados durante el examen).     SEGUIMIENTO: Nuestro personal llamar al nmero que aparece en su historial al siguiente da hbil de su procedimiento para ver cmo se siente y para responder cualquier pregunta o inquietud que pueda tener con respecto a la informacin que se le dio despus del procedimiento. Si no podemos contactarle, le dejaremos un mensaje.  Sin embargo, si se siente bien y no tiene Paediatric nurse, no es necesario que nos devuelva la llamada.  Asumiremos que ha regresado a sus actividades diarias normales sin incidentes. Si se le tomaron algunas biopsias, le contactaremos por telfono o por carta en las prximas 3 semanas.  Si no ha sabido [employer] biopsias en el transcurso de 3 semanas, por favor llmenos al [phone].  FIRMAS/CONFIDENCIALIDAD: Usted y/o el acompaante que le cuide han firmado documentos que se ingresarn en su historial mdico electrnico.  Estas firmas atestiguan el hecho de que la informacin anterior

## 2019-03-28 NOTE — Op Note (Signed)
[place] Patient Name: Shannon Schmitt Procedure Date: 03/28/2019 12:57 PM MRN: [id number] Endoscopist: Gerrit Heck , MD Age: 58 Referring MD:  Date of Birth: 02/15/1961 Gender: Female Account #: [id number] Procedure:                Colonoscopy Indications:              Upper abdominal pain, Hematochezia. She had a                            colonoscopy in [place] approximately 7 years                            ago and notable for polyps (unknown size, location,                            number, histology). Medicines:                Monitored Anesthesia Care Procedure:                Pre-Anesthesia Assessment:                           - Prior to the procedure, a History and Physical                            was performed, and patient medications and                            allergies were reviewed. The patient's tolerance of                            previous anesthesia was also reviewed. The risks                            and benefits of the procedure and the sedation                            options and risks were discussed with the patient.                            All questions were answered, and informed consent                            was obtained. Prior Anticoagulants: The patient has                            taken no previous anticoagulant or antiplatelet                            agents. ASA Grade Assessment: III - A patient with                            severe systemic disease. After reviewing the risks  and benefits, the patient was deemed in                            satisfactory condition to undergo the procedure.                           After obtaining informed consent, the colonoscope                            was passed under direct vision. Throughout the                            procedure, the patient's blood pressure, pulse, and                            oxygen saturations were monitored  continuously. The                            Colonoscope was introduced through the anus and                            advanced to the the terminal ileum. The colonoscopy                            was performed without difficulty. The patient                            tolerated the procedure well. The quality of the                            bowel preparation was adequate. The terminal ileum,                            ileocecal valve, appendiceal orifice, and rectum                            were photographed. Scope In: 1:24:18 PM Scope Out: 1:39:38 PM Scope Withdrawal Time: 0 hours 11 minutes 56 seconds  Total Procedure Duration: 0 hours 15 minutes 20 seconds  Findings:                 Skin tags were found on perianal exam.                           Four sessile polyps were found in the sigmoid colon                            (1), hepatic flexure (2), and cecum (1). The polyps                            were 2 to 4 mm in size. These polyps were removed                            with a cold biopsy forceps.  Resection and retrieval                            were complete. Estimated blood loss was minimal.                           Non-bleeding internal hemorrhoids were found during                            retroflexion. The hemorrhoids were small. The                            remainder of the colon was otherwise normal                            appearing.                           The terminal ileum appeared normal. Complications:            No immediate complications. Estimated Blood Loss:     Estimated blood loss was minimal. Impression:               - Perianal skin tags found on perianal exam.                           - Four 2 to 4 mm polyps in the sigmoid colon, at                            the hepatic flexure and in the cecum, removed with                            a cold biopsy forceps. Resected and retrieved.                           - Non-bleeding internal  hemorrhoids.                           - The examined portion of the ileum was normal. Recommendation:           - Patient has a contact number available for                            emergencies. The signs and symptoms of potential                            delayed complications were discussed with the                            patient. Return to normal activities tomorrow.                            Written discharge instructions were provided to the                            patient.                           -  Resume previous diet.                           - Continue present medications.                           - Await pathology results.                           - Repeat colonoscopy for surveillance based on                            pathology results.                           - Use fiber, for example Citrucel, Fibercon, Konsyl                            or Metamucil.                           - Return to GI clinic PRN.                           - Internal hemorrhoids were noted on this study and                            may be amenable to hemorrhoid band ligation. If you                            are interested in further treatment of these                            hemorrhoids with band ligation, please contact my                            clinic to set up an appointment for evaluation and                            treatment. Gerrit Heck, MD 03/28/2019 1:51:15 PM

## 2019-03-28 NOTE — Op Note (Signed)
[place] Patient Name: Shannon Schmitt Procedure Date: 03/28/2019 12:58 PM MRN: [id number] Endoscopist: Gerrit Heck , MD Age: 58 Referring MD:  Date of Birth: Dec 11, 1960 Gender: Female Account #: [id number] Procedure:                Upper GI endoscopy Indications:              Epigastric abdominal pain, Upper abdominal pain,                            Esophageal reflux, Hematochezia, Nausea Medicines:                Monitored Anesthesia Care Procedure:                Pre-Anesthesia Assessment:                           - Prior to the procedure, a History and Physical                            was performed, and patient medications and                            allergies were reviewed. The patient's tolerance of                            previous anesthesia was also reviewed. The risks                            and benefits of the procedure and the sedation                            options and risks were discussed with the patient.                            All questions were answered, and informed consent                            was obtained. Prior Anticoagulants: The patient has                            taken no previous anticoagulant or antiplatelet                            agents. ASA Grade Assessment: III - A patient with                            severe systemic disease. After reviewing the risks                            and benefits, the patient was deemed in                            satisfactory condition to undergo the procedure.  After obtaining informed consent, the endoscope was                            passed under direct vision. Throughout the                            procedure, the patient's blood pressure, pulse, and                            oxygen saturations were monitored continuously. The                            Endoscope was introduced through the mouth, and   advanced to the second part of duodenum. The upper                            GI endoscopy was accomplished without difficulty.                            The patient tolerated the procedure well. Scope In: Scope Out: Findings:                 The examined esophagus was normal.                           The Z-line was regular and was found 36 cm from the                            incisors.                           The gastroesophageal flap valve was visualized                            endoscopically and classified as Hill Grade II                            (fold present, opens with respiration).                           Multiple 2 to 6 mm sessile polyps with no bleeding                            and no stigmata of recent bleeding were found in                            the gastric fundus and in the gastric body. Several                            of these polyps were removed with a cold biopsy                            forceps for histologic representative evaluation.  Resection and retrieval were complete. Estimated                            blood loss was minimal.                           The mucosa was otherwise normal appearing                            throughout the stomach. Biopsies were taken with a                            cold forceps for Helicobacter pylori testing.                            Estimated blood loss was minimal.                           The duodenal bulb, first portion of the duodenum                            and second portion of the duodenum were normal. Complications:            No immediate complications. Estimated Blood Loss:     Estimated blood loss was minimal. Impression:               - Normal esophagus.                           - Z-line regular, 36 cm from the incisors.                           - Gastroesophageal flap valve classified as Hill                            Grade II (fold present, opens with  respiration).                           - Multiple gastric polyps. Resected and retrieved.                           - Normal mucosa was found in the entire stomach.                            Biopsied.                           - Normal duodenal bulb, first portion of the                            duodenum and second portion of the duodenum. Recommendation:           - Patient has a contact number available for                            emergencies. The signs and symptoms  of potential                            delayed complications were discussed with the                            patient. Return to normal activities tomorrow.                            Written discharge instructions were provided to the                            patient.                           - Resume previous diet.                           - Continue present medications.                           - Await pathology results.                           - Return to GI clinic at appointment to be                            scheduled. Gerrit Heck, MD 03/28/2019 1:45:31 PM

## 2019-03-28 NOTE — Progress Notes (Signed)
PT taken to PACU. Monitors in place. VSS. Report given to RN. 

## 2019-03-28 NOTE — Progress Notes (Signed)
Called to room to assist during endoscopic procedure.  Patient ID and intended procedure confirmed with present staff. Received instructions for my participation in the procedure from the performing physician.  

## 2019-03-30 ENCOUNTER — Telehealth: Payer: Self-pay

## 2019-03-30 NOTE — Telephone Encounter (Signed)
Covid-19 screening questions   Do you now or have you had a fever in the last 14 days? No.  Do you have any respiratory symptoms of shortness of breath or cough now or in the last 14 days? No.  Do you have any family members or close contacts with diagnosed or suspected Covid-19 in the past 14 days? No.  Have you been tested for Covid-19 and found to be positive? No.Spoke to patient's husband.  He said she had been tested, but test was negative, and she is fine.       Follow up Call-  Call back number 03/28/2019 03/28/2019  Post procedure Call Back phone  # [phone] husband speaks english -  Permission to leave phone message Yes Yes  Some recent data might be hidden     Patient questions:  Do you have a fever, pain , or abdominal swelling? No. Pain Score  0 *  Have you tolerated food without any problems? Yes.    Have you been able to return to your normal activities? Yes.    Do you have any questions about your discharge instructions: Diet   No. Medications  No. Follow up visit  No.  Do you have questions or concerns about your Care? No.  Actions: * If pain score is 4 or above: No action needed, pain <4.

## 2019-04-24 ENCOUNTER — Telehealth: Payer: Self-pay

## 2019-04-24 NOTE — Telephone Encounter (Signed)
Covid-19 screening questions   Do you now or have you had a fever in the last 14 days?  Do you have any respiratory symptoms of shortness of breath or cough now or in the last 14 days?  Do you have any family members or close contacts with diagnosed or suspected Covid-19 in the past 14 days?  Have you been tested for Covid-19 and found to be positive?      Left voicemail for patient to call back

## 2019-04-25 ENCOUNTER — Ambulatory Visit: Payer: 59 | Admitting: Gastroenterology

## 2020-04-27 ENCOUNTER — Other Ambulatory Visit: Payer: Self-pay

## 2020-04-27 ENCOUNTER — Emergency Department (INDEPENDENT_AMBULATORY_CARE_PROVIDER_SITE_OTHER): Payer: BC Managed Care – PPO

## 2020-04-27 ENCOUNTER — Emergency Department (INDEPENDENT_AMBULATORY_CARE_PROVIDER_SITE_OTHER)
Admission: EM | Admit: 2020-04-27 | Discharge: 2020-04-27 | Disposition: A | Discharge status: Home / Self Care | Payer: BC Managed Care – PPO | Source: Home / Self Care

## 2020-04-27 DIAGNOSIS — J069 Acute upper respiratory infection, unspecified: Secondary | ICD-10-CM

## 2020-04-27 DIAGNOSIS — Z20822 Contact with and (suspected) exposure to covid-19: Secondary | ICD-10-CM | POA: Diagnosis not present

## 2020-04-27 DIAGNOSIS — R05 Cough: Secondary | ICD-10-CM | POA: Diagnosis not present

## 2020-04-27 DIAGNOSIS — R059 Cough, unspecified: Secondary | ICD-10-CM

## 2020-04-27 DIAGNOSIS — R0989 Other specified symptoms and signs involving the circulatory and respiratory systems: Secondary | ICD-10-CM

## 2020-04-27 MED ORDER — ALBUTEROL SULFATE HFA 108 (90 BASE) MCG/ACT IN AERS: 1.0000 | INHALATION_SPRAY | Freq: Four times a day (QID) | RESPIRATORY_TRACT | 0 refills | Status: AC | PRN

## 2020-04-27 MED ORDER — BENZONATATE 100 MG PO CAPS: 100.0000 mg | ORAL_CAPSULE | Freq: Three times a day (TID) | ORAL | 0 refills | Status: AC

## 2020-04-27 NOTE — Discharge Instructions (Signed)
°  Due to concern for possibly having Covid-19, it is advised that you self-isolate at home until test results come back, usually 2-3 days.  If positive, it is recommended you stay isolated for at least 10 days after symptom onset and 24 hours after last fever without taking medication (whichever is longer).  If you MUST go out, please wear a mask at all times, limit contact with others.   Call your family doctor tomorrow for recheck of symptoms later in the week if not improving.  Call 911 or go to hospital if symptoms worsening- chest pain, trouble breathing.

## 2020-04-27 NOTE — ED Triage Notes (Signed)
Pt's husband has had known covid exposure at work. Pt has been feeling fatigued for 4 days now. She self treated with azythromycin at home 250mg  once a day for 7 days. Course finished on Thursday. Pt has also been taking  Promethazine thinking it was for cough/chest congestion. She also has used albuterol nebulizer to help with congestion. She has been fully vaccinated for covid [employer].

## 2020-04-27 NOTE — ED Provider Notes (Signed)
[facility]    CSN: [id number] Arrival date & time: 04/27/20  9622      History   Chief Complaint Chief Complaint  Patient presents with  . Cough  . Fatigue    HPI Shannon Schmitt is a 59 y.o. female.   Spanish speaking, language interpreter used throughout visit.   HPI  Shannon Schmitt is a 59 y.o. female presenting to UC with c/o cough, congestion, and fatigue since Monday.  She notes her husband has been sick for several weeks prior.  Two days ago, they found out someone at husband's work tested positive for Covid-19.  Pt received the full course of Pfizer Covid-19 vaccine a few months ago.  She started self-treating her cough on Monday with leftover azithromycin, took 250mg  once daily for 4 days.  She has also taken promethazine/DM cough medication prescribed by her PCP and used her home nebulizer machine but no relief.  Denies fever, chills, n/v/d. Denies chest pain or SOB at this time.    Past Medical History:  Diagnosis Date  . Allergic   . Asthma   . Colon polyps   . Diabetes mellitus without complication ([place])   . Fibromyalgia     Patient Active Problem List   Diagnosis Date Noted  . Rectal bleeding 03/23/2019  . Epigastric pain 03/23/2019  . Elevated glucose 05/04/2017  . Elevated liver enzymes 05/04/2017  . INTRINSIC ASTHMA, UNSPECIFIED 10/21/2010  . Acute asthmatic bronchitis 10/07/2010  . ALLERGIC RHINITIS 10/07/2010  . GERD, SEVERE 10/07/2010    Past Surgical History:  Procedure Laterality Date  . CHOLECYSTECTOMY    . FEMUR FRACTURE SURGERY Right    with rod  . TONSILLECTOMY    . TOTAL ABDOMINAL HYSTERECTOMY      OB History   No obstetric history on file.      Home Medications    Prior to Admission medications   Medication Sig Start Date End Date Taking? Authorizing Provider  albuterol (ACCUNEB) 1.25 MG/3ML nebulizer solution Take 1 ampule by nebulization every 6 (six) hours as needed for wheezing.     [provider]  albuterol (PROVENTIL HFA;VENTOLIN HFA) 108 (90 Base) MCG/ACT inhaler Inhale 2 puffs into the lungs every 6 (six) hours as needed for wheezing or shortness of breath. 06/20/18   Mannam, Hart Robinsons, MD  albuterol (PROVENTIL) (2.5 MG/3ML) 0.083% nebulizer solution Take 3 mLs (2.5 mg total) by nebulization every 6 (six) hours as needed for wheezing or shortness of breath. 06/20/18   Mannam, Hart Robinsons, MD  albuterol (VENTOLIN HFA) 108 (90 Base) MCG/ACT inhaler Inhale 1-2 puffs into the lungs every 6 (six) hours as needed for wheezing or shortness of breath. 04/27/20   Noe Gens, PA-C  benzonatate (TESSALON) 100 MG capsule Take 1-2 capsules (100-200 mg total) by mouth every 8 (eight) hours. 04/27/20   Noe Gens, PA-C  budesonide-formoterol (SYMBICORT) 160-4.5 MCG/ACT inhaler Inhale 2 puffs into the lungs 2 (two) times daily. 06/20/18   Mannam, Hart Robinsons, MD  dexamethasone (DECADRON) 0.5 MG/5ML solution Take by mouth daily.    [provider]  lansoprazole (PREVACID) 30 MG capsule Take 1 capsule (30 mg total) by mouth 2 (two) times daily before a meal. 03/20/19   Zehr, Janett Billow D, PA-C  QUEtiapine (SEROQUEL) 25 MG tablet Take 1 tablet by mouth as needed.    [provider]  sitaGLIPtin (JANUVIA) 100 MG tablet Take 1 tablet by mouth daily.    [provider]  zolpidem Lorrin Mais)  10 MG tablet Take 1 tablet by mouth daily as needed.    [provider]    Family History Family History  Problem Relation Age of Onset  . Diabetes Mother   . Heart disease Mother   . Breast cancer Mother   . Stroke Mother   . Colon polyps Father     Social History Social History   Tobacco Use  . Smoking status: Never Smoker  . Smokeless tobacco: Never Used  Vaping Use  . Vaping Use: Never used  Substance Use Topics  . Alcohol use: No  . Drug use: No     Allergies   Penicillins, Aspirin, Avelox [moxifloxacin hcl], Codeine, and Shellfish  allergy   Review of Systems Review of Systems  Constitutional: Positive for fatigue. Negative for chills and fever.  HENT: Positive for congestion. Negative for ear pain, sore throat, trouble swallowing and voice change.   Respiratory: Positive for cough. Negative for shortness of breath.   Cardiovascular: Negative for chest pain and palpitations.  Gastrointestinal: Negative for abdominal pain, diarrhea, nausea and vomiting.  Musculoskeletal: Negative for arthralgias, back pain and myalgias.  Skin: Negative for rash.  All other systems reviewed and are negative.    Physical Exam Triage Vital Signs ED Triage Vitals  Enc Vitals Group     BP 04/27/20 1018 134/86     Pulse --      Resp 04/27/20 1018 19     Temp 04/27/20 1018 98.9 F (37.2 C)     Temp Source 04/27/20 1018 Oral     SpO2 04/27/20 1018 97 %     Weight 04/27/20 1017 147 lb (66.7 kg)     Height 04/27/20 1017 4\' 9"  (1.448 m)     Head Circumference --      Peak Flow --      Pain Score 04/27/20 1017 0     Pain Loc --      Pain Edu? --      Excl. in [place]? --    No data found.  Updated Vital Signs BP 134/86 (BP Location: Right Arm)   Temp 98.9 F (37.2 C) (Oral)   Resp 19   Ht 4\' 9"  (1.448 m)   Wt 147 lb (66.7 kg)   SpO2 97%   BMI 31.81 kg/m   Visual Acuity Right Eye Distance:   Left Eye Distance:   Bilateral Distance:    Right Eye Near:   Left Eye Near:    Bilateral Near:     Physical Exam Vitals and nursing note reviewed.  Constitutional:      General: She is not in acute distress.    Appearance: Normal appearance. She is well-developed. She is not ill-appearing, toxic-appearing or diaphoretic.  HENT:     Head: Normocephalic and atraumatic.     Right Ear: Tympanic membrane and ear canal normal.     Left Ear: Tympanic membrane and ear canal normal.     Nose: Nose normal.     Right Sinus: No maxillary sinus tenderness or frontal sinus tenderness.     Left Sinus: No maxillary sinus tenderness or  frontal sinus tenderness.     Mouth/Throat:     Lips: Pink.     Mouth: Mucous membranes are moist.     Pharynx: Oropharynx is clear. Uvula midline.  Cardiovascular:     Rate and Rhythm: Normal rate and regular rhythm.  Pulmonary:     Effort: Pulmonary effort is normal. No respiratory distress.  Breath sounds: Normal breath sounds. No stridor. No wheezing, rhonchi or rales.  Musculoskeletal:        General: Normal range of motion.     Cervical back: Normal range of motion.  Skin:    General: Skin is warm and dry.  Neurological:     Mental Status: She is alert and oriented to person, place, and time.  Psychiatric:        Behavior: Behavior normal.      UC Treatments / Results  Labs (all labs ordered are listed, but only abnormal results are displayed) Labs Reviewed  NOVEL CORONAVIRUS, NAA    EKG   Radiology DG Chest 2 View  Result Date: 04/27/2020 CLINICAL DATA:  Cough and congestion.  COVID exposure EXAM: CHEST - 2 VIEW COMPARISON:  06/20/2018 FINDINGS: Normal heart size and mediastinal contours. No acute infiltrate or edema. No effusion or pneumothorax. No acute osseous findings. Cholecystectomy clips. IMPRESSION: No active cardiopulmonary disease. Electronically Signed   By: Monte Fantasia M.D.   On: 04/27/2020 11:30    Procedures Procedures (including critical care time)  Medications Ordered in UC Medications - No data to display  Initial Impression / Assessment and Plan / UC Course  I have reviewed the triage vital signs and the nursing notes.  Pertinent labs & imaging results that were available during my care of the patient were reviewed by me and considered in my medical decision making (see chart for details).     Pt c/o URI symptoms for 1 week Reassured pt of normal CXR Covid-19 test pending She has already complete 4 days of azythromycin Advised pt the cough medication she was prescribed can cause fatigue. She may discontinue that cough medication  if not finding relief. Encouraged fluids and rest F/u with PCP Discussed symptoms that warrant emergent care in the ED. AVS given   Final Clinical Impressions(s) / UC Diagnoses   Final diagnoses:  Acute upper respiratory infection  Exposure to COVID-19 virus  Close exposure to COVID-19 virus  Cough     Discharge Instructions      Due to concern for possibly having Covid-19, it is advised that you self-isolate at home until test results come back, usually 2-3 days.  If positive, it is recommended you stay isolated for at least 10 days after symptom onset and 24 hours after last fever without taking medication (whichever is longer).  If you MUST go out, please wear a mask at all times, limit contact with others.   Call your family doctor tomorrow for recheck of symptoms later in the week if not improving.  Call 911 or go to hospital if symptoms worsening- chest pain, trouble breathing.     ED Prescriptions    Medication Sig Dispense Auth. Provider   benzonatate (TESSALON) 100 MG capsule Take 1-2 capsules (100-200 mg total) by mouth every 8 (eight) hours. 21 capsule Gerarda Fraction, Seva Chancy O, PA-C   albuterol (VENTOLIN HFA) 108 (90 Base) MCG/ACT inhaler Inhale 1-2 puffs into the lungs every 6 (six) hours as needed for wheezing or shortness of breath. 18 g Noe Gens, PA-C     PDMP not reviewed this encounter.   Noe Gens, [place] 04/27/20 1339

## 2020-04-29 ENCOUNTER — Ambulatory Visit (INDEPENDENT_AMBULATORY_CARE_PROVIDER_SITE_OTHER): Payer: BC Managed Care – PPO | Admitting: Adult Health

## 2020-04-29 ENCOUNTER — Other Ambulatory Visit: Payer: Self-pay

## 2020-04-29 ENCOUNTER — Telehealth: Payer: Self-pay | Admitting: Pulmonary Disease

## 2020-04-29 ENCOUNTER — Telehealth: Payer: Self-pay | Admitting: Adult Health

## 2020-04-29 ENCOUNTER — Encounter: Payer: Self-pay | Admitting: Adult Health

## 2020-04-29 DIAGNOSIS — J45909 Unspecified asthma, uncomplicated: Secondary | ICD-10-CM

## 2020-04-29 LAB — SARS-COV-2, NAA 2 DAY TAT

## 2020-04-29 LAB — NOVEL CORONAVIRUS, NAA: SARS-CoV-2, NAA: NOT DETECTED

## 2020-04-29 MED ORDER — PREDNISONE 10 MG PO TABS: ORAL_TABLET | ORAL | 0 refills | Status: AC

## 2020-04-29 MED ORDER — BUDESONIDE-FORMOTEROL FUMARATE 160-4.5 MCG/ACT IN AERO
2.0000 | INHALATION_SPRAY | Freq: Two times a day (BID) | RESPIRATORY_TRACT | 11 refills | Status: AC
Start: 2020-04-29 — End: ?

## 2020-04-29 NOTE — Progress Notes (Signed)
@Patient  ID: Shannon Schmitt, female    DOB: July 06, 1961, 59 y.o.   MRN: [id number]  Chief Complaint  Patient presents with  . Acute Visit    asthma     Referring provider: Concha Pyo, PA  HPI: 59 year old female Spanish-speaking followed for moderate persistent asthma  TEST/EVENTS :  06/2018 IgE 692 , eos 100 .   04/29/2020 Acute OV : Asthma (today's visit was via virtual interpreter) Patient presents for an acute office visit.  Patient complains over the last 7 days that she has had increased cough minimally productive of intermittent drainage sore throat stuffy nose and wheezing and tightness.  Patient did have an indirect possible COVID-19 exposure.  Patient has been fully vaccinated several months ago.  Her husband is also been vaccinated.  Her husband was exposed to a possible person at work.  Patient took a Covid home test that was negative.  She went to the emergency room on April 27, 2020.  Chest x-ray was clear.  COVID-19 test was negative.  Patient took 7 days of azithromycin which today was her seventh day.  Is unclear where she got this prescription.  She says that she is feeling slightly better but continues to have cough and wheezing. Patient denies any chest pain orthopnea calf pain hemoptysis fever loss of taste or smell.   Allergies  Allergen Reactions  . Ibuprofen Swelling  . Iodinated Diagnostic Agents Anaphylaxis  . Iodine Anaphylaxis  . Metrizamide Anaphylaxis  . Penicillins Swelling  . Sulfa Antibiotics Anaphylaxis  . Sulfasalazine Anaphylaxis  . Other Swelling  . Avelox [Moxifloxacin Hcl]   . Codeine   . Shellfish Allergy   . Sitagliptin Other (See Comments)    Immunization History  Administered Date(s) Administered  . PFIZER SARS-COV-2 Vaccination 11/24/2019, 12/15/2019  . Unspecified SARS-COV-2 Vaccination 11/24/2019    Past Medical History:  Diagnosis Date  . Allergic   . Asthma   . Colon polyps   . Diabetes mellitus  without complication ([place])   . Fibromyalgia     Tobacco History: Social History   Tobacco Use  Smoking Status Never Smoker  Smokeless Tobacco Never Used   Counseling given: Not Answered   Outpatient Medications Prior to Visit  Medication Sig Dispense Refill  . albuterol (ACCUNEB) 1.25 MG/3ML nebulizer solution Take 1 ampule by nebulization every 6 (six) hours as needed for wheezing.    Marland Kitchen albuterol (PROVENTIL HFA;VENTOLIN HFA) 108 (90 Base) MCG/ACT inhaler Inhale 2 puffs into the lungs every 6 (six) hours as needed for wheezing or shortness of breath. 1 Inhaler 2  . albuterol (PROVENTIL) (2.5 MG/3ML) 0.083% nebulizer solution Take 3 mLs (2.5 mg total) by nebulization every 6 (six) hours as needed for wheezing or shortness of breath. 75 mL 12  . albuterol (VENTOLIN HFA) 108 (90 Base) MCG/ACT inhaler Inhale 1-2 puffs into the lungs every 6 (six) hours as needed for wheezing or shortness of breath. 18 g 0  . azithromycin (ZITHROMAX) 250 MG tablet Zithromax Z-Pak 250 mg tablet  TAKE 2 TABLETS (500 MG) BY ORAL ROUTE ONCE DAILY FOR 1 DAY THEN 1 TABLET (250 MG) BY ORAL ROUTE ONCE DAILY FOR 4 DAYS    . benzonatate (TESSALON) 100 MG capsule Take 1-2 capsules (100-200 mg total) by mouth every 8 (eight) hours. 21 capsule 0  . clonazePAM (KLONOPIN) 1 MG tablet clonazepam 1 mg tablet  Take 1 tablet every day by oral route as needed.    Marland Kitchen dexamethasone (DECADRON) 0.5 MG/5ML solution  Take by mouth daily.    Marland Kitchen glucose blood test strip True Metrix Glucose Test Strip  Take 1 strip every day by miscell. route.    . lansoprazole (PREVACID) 30 MG capsule Take 1 capsule (30 mg total) by mouth 2 (two) times daily before a meal. 60 capsule 3  . metFORMIN (GLUCOPHAGE-XR) 750 MG 24 hr tablet metformin ER 750 mg tablet,extended release 24 hr  TAKE 1 TABLET TWICE A DAY BY ORAL ROUTE    . methocarbamol (ROBAXIN) 750 MG tablet methocarbamol 750 mg tablet  TAKE 1 TABLET EVERY 12 HOURS BY ORAL ROUTE FOR 7 DAYS.    Marland Kitchen  promethazine-dextromethorphan (PROMETHAZINE-DM) 6.25-15 MG/5ML syrup promethazine-DM 6.25 mg-15 mg/5 mL oral syrup  Take 5 mL every 6 hours by oral route for 5 days.    Marland Kitchen QUEtiapine (SEROQUEL) 25 MG tablet Take 1 tablet by mouth as needed.    . sitaGLIPtin (JANUVIA) 100 MG tablet Take 1 tablet by mouth daily.    Marland Kitchen zolpidem (AMBIEN) 10 MG tablet Take 1 tablet by mouth daily as needed.    . budesonide-formoterol (SYMBICORT) 160-4.5 MCG/ACT inhaler Inhale 2 puffs into the lungs 2 (two) times daily. (Patient not taking: Reported on 04/29/2020) 1 Inhaler 6   No facility-administered medications prior to visit.     Review of Systems:   Constitutional:   No  weight loss, night sweats,  Fevers, chills, fatigue, or  lassitude.  HEENT:   No headaches,  Difficulty swallowing,  Tooth/dental problems, or +sore throat,                No sneezing, itching, ear ache,  +nasal congestion, post nasal drip,   CV:  No chest pain,  Orthopnea, PND, swelling in lower extremities, anasarca, dizziness, palpitations, syncope.   GI  No heartburn, indigestion, abdominal pain, nausea, vomiting, diarrhea, change in bowel habits, loss of appetite, bloody stools.   Resp:  .  No chest wall deformity  Skin: no rash or lesions.  GU: no dysuria, change in color of urine, no urgency or frequency.  No flank pain, no hematuria   MS:  No joint pain or swelling.  No decreased range of motion.  No back pain.    Physical Exam  BP 130/70 (BP Location: Left Arm, Cuff Size: Normal)   Pulse 89   Temp (!) 97.3 F (36.3 C) (Oral)   Ht 4\' 9"  (1.448 m)   Wt 146 lb 6.4 oz (66.4 kg)   SpO2 98%   BMI 31.68 kg/m   GEN: A/Ox3; pleasant , NAD, well nourished    HEENT:  [place]/AT,   , NOSE-clear, THROAT-clear, no lesions, no postnasal drip or exudate noted.   NECK:  Supple w/ fair ROM; no JVD; normal carotid impulses w/o bruits; no thyromegaly or nodules palpated; no lymphadenopathy.   No stridor noted  RESP  Clear  P & A; w/o,  wheezes/ rales/ or rhonchi. no accessory muscle use, no dullness to percussion Speaks in full sentences.  CARD:  RRR, no m/r/g, no peripheral edema, pulses intact, no cyanosis or clubbing.  GI:   Soft & nt; nml bowel sounds; no organomegaly or masses detected.   Musco: Warm bil, no deformities or joint swelling noted.   Neuro: alert, no focal deficits noted.    Skin: Warm, no lesions or rashes     BNP No results found for: BNP  ProBNP No results found for: PROBNP  Imaging: DG Chest 2 View  Result Date: 04/27/2020 CLINICAL DATA:  Cough and congestion.  COVID exposure EXAM: CHEST - 2 VIEW COMPARISON:  06/20/2018 FINDINGS: Normal heart size and mediastinal contours. No acute infiltrate or edema. No effusion or pneumothorax. No acute osseous findings. Cholecystectomy clips. IMPRESSION: No active cardiopulmonary disease. Electronically Signed   By: Monte Fantasia M.D.   On: 04/27/2020 11:30      PFT Results Latest Ref Rng & Units 07/03/2018  FVC-Pre L 2.49  FVC-Predicted Pre % 93  FVC-Post L 2.44  FVC-Predicted Post % 91  Pre FEV1/FVC % % 88  Post FEV1/FCV % % 91  FEV1-Pre L 2.19  FEV1-Predicted Pre % 105  FEV1-Post L 2.23  DLCO uncorrected ml/min/mmHg 16.33  DLCO UNC% % 105  DLCO corrected ml/min/mmHg 16.33  DLCO COR %Predicted % 105  DLVA Predicted % 114  TLC L 3.79  TLC % Predicted % 92  RV % Predicted % 83    No results found for: NITRICOXIDE      Assessment & Plan:   Acute asthmatic bronchitis Slow to resolve asthmatic bronchitic exacerbation.  Chest x-ray on April 27, 2020 showed no acute process.  COVID-19 testing is negative.  Patient has been fully vaccinated. Patient is advised to use her albuterol albuterol nebulizer as needed.  We will treat with a short course of prednisone.  Discussion with patient regarding her underlying diabetes 10 she continue to check blood sugars daily and report any blood sugars greater than 250 to her primary  provider. Asthma action plan discussed Instructions were given via interpreter and also written instructions were placed in Spanish via Google translator  Plan  Prednisone taper over next week  Mucinex DM Twice daily  As needed  Cough/congestion  Albuterol inhaler or neb As needed  As needed for wheezing .  Continue on Symbicort 2 puffs Twice daily  , rinse after use.  Follow up with Dr. Vaughan Browner in 6-8 weeks and As needed   Please contact office for sooner follow up if symptoms do not improve or worsen or seek emergency care   Call primary provider if blood sugars are >250 . Prednisone can cause elevated blood sugars.       Rexene Edison, NP 04/29/2020

## 2020-04-29 NOTE — Telephone Encounter (Signed)
Called and spoke with Shannon Schmitt per DPR he states that her Symbicort was supposed to be sent to pharmacy. Looks like only Prednisone RX was sent. Refill for Symbicort has been sent. Nothing further needed at this time.

## 2020-04-29 NOTE — Assessment & Plan Note (Addendum)
Slow to resolve asthmatic bronchitic exacerbation.  Chest x-ray on April 27, 2020 showed no acute process.  COVID-19 testing is negative.  Patient has been fully vaccinated. Patient is advised to use her albuterol albuterol nebulizer as needed.  We will treat with a short course of prednisone.  Discussion with patient regarding her underlying diabetes 10 she continue to check blood sugars daily and report any blood sugars greater than 250 to her primary provider. Asthma action plan discussed Instructions were given via interpreter and also written instructions were placed in Spanish via Google translator  Plan  Prednisone taper over next week  Mucinex DM Twice daily  As needed  Cough/congestion  Albuterol inhaler or neb As needed  As needed for wheezing .  Continue on Symbicort 2 puffs Twice daily  , rinse after use.  Follow up with Dr. Vaughan Browner in 6-8 weeks and As needed   Please contact office for sooner follow up if symptoms do not improve or worsen or seek emergency care   Call primary provider if blood sugars are >250 . Prednisone can cause elevated blood sugars.

## 2020-04-29 NOTE — Patient Instructions (Addendum)
Prednisone taper over next week  Mucinex DM Twice daily  As needed  Cough/congestion  Albuterol inhaler or neb As needed  As needed for wheezing .  Continue on Symbicort 2 puffs Twice daily  , rinse after use.  Follow up with Dr. Vaughan Browner in 6-8 weeks and As needed   Please contact office for sooner follow up if symptoms do not improve or worsen or seek emergency care   Call primary provider if blood sugars are >250 . Prednisone can cause elevated blood sugars.   Disminucin de la prednisona durante la prxima semana Mucinex DM Dos veces al da Segn sea necesario Tos / congestin Inhalador de albuterol o neb Segn sea necesario Segn sea necesario para las sibilancias. Contine con Symbicort 2 inhalaciones dos veces al da, enjuague despus de su uso. Seguimiento con el Dr. Vaughan Browner en 6-8 semanas y segn sea necesario Comunquese con la oficina para un seguimiento ms temprano si los sntomas no mejoran o empeoran o busque atencin de emergencia  Llame al proveedor de atencin primaria si los niveles de Museum/gallery exhibitions officer son> 250. La prednisona puede causar niveles elevados de azcar en sangre.

## 2020-04-29 NOTE — Telephone Encounter (Signed)
Primary Pulmonologist: Mannam Last office visit and with whom: 07/03/18 with Dr. Vaughan Browner What do we see them for (pulmonary problems): SOB, asthma Last OV assessment/plan:  Assessment:  Moderate persistent asthma Not on controller medication.  Seen today after a 5-year interval in pulmonary clinic with acute exacerbation We will treat with prednisone for 5 days.  Avoid prolonged course of prednisone as she has diabetes with elevated blood sugars Start Symbicort 160, continue albuterol nebulizers as needed inhaler.  Give albuterol rescue inhaler  Get chest x-ray, CBC differential, IgE and PFTs for baseline evaluation  Severe GERD Reflux is contributing to ongoing symptoms Continue Prilosec twice daily Add Pepcid 20 mg at night. If she continues to have uncontrolled symptoms then consider GI eval.  Plan/Recommendations: - Prednisone 40 mg a day for 5 days - Start Symbicort, continue albuterol as needed - Chest x-ray, CBC, IgE, PFTs - Continue Prilosec, add Pepcid 20 mg at night.  Marshell Garfinkel MD [facility] 06/20/2018, 9:22 AM  CC: Emeterio Reeve, DO      Patient Instructions by Marshell Garfinkel, MD at 06/20/2018 9:15 AM Author: Marshell Garfinkel, MD Author Type: Physician Filed: 06/20/2018 9:58 AM  Note Status: Addendum Cosign: Cosign Not Required Encounter Date: 06/20/2018  Editor: Marshell Garfinkel, MD (Physician)      Prior Versions: 1. Shon Hale, CMA Estate agent) at 06/20/2018 9:57 AM - Addendum   2. Marshell Garfinkel, MD (Physician) at 06/20/2018 9:44 AM - Signed      We will get a chest x-ray, CBC differential, IgE and CRP today  We will give a prescription for Symbicort 160, continue albuterol nebulizer as needed.  Will give a prescription for the albuterol  We will give prednisone 40 mg a day for 5 days.  Please monitor your sugars while on this medication Add prilosec 20mg  at night for acid  reflux Follow-up in 2 to 4 weeks with full pulmonary function test.     Pt had PFT performed 07/03/18. Pt was called 07/10/18 to discuss the results of the PFT but we were unable to reach pt to discuss the results.  Was appointment offered to patient (explain)?  appt scheduled.   Reason for call: called and spoke with pt's husband Audelia Hives who states that pt has had complaints of a cough and increased SOB x4 days.  Audelia Hives stated that pt was tested for covid 8/22 and pt tested negative for covid which is able to be seen under lab tab of pt's chart. Pt has received her covid vaccines. Under immunization history of pt's chart, pt received Pfizer vaccine 3/20 and 4/10.  Asked Audelia Hives if pt has been around anyone that has had covid and he stated no.  Audelia Hives states that pt is not getting any phlegm up when she coughs. Pt has not had any fever but he could not tell me what her temp has been.  Pt has been using mucinex, symbicort, her albuterol inhaler at least three times daily.  Since it has been since 2019 when pt was last seen, pt does need an appt. Pt has been scheduled an appt today 8/24 at 11:30 with TP. Nothing further needed.   Allergies  Allergen Reactions  . Penicillins Swelling  . Aspirin   . Avelox [Moxifloxacin Hcl]   . Codeine   . Shellfish Allergy     Immunization History  Administered Date(s) Administered  . PFIZER SARS-COV-2 Vaccination 11/24/2019, 12/15/2019

## 2020-05-02 ENCOUNTER — Telehealth: Payer: Self-pay | Admitting: Adult Health

## 2020-05-02 MED ORDER — AZITHROMYCIN 250 MG PO TABS: ORAL_TABLET | ORAL | 0 refills | Status: AC

## 2020-05-02 NOTE — Telephone Encounter (Signed)
Spoke with patient's husband. She was made aware of TP's recs. Medication has been sent in. Nothing further needed at time of call.

## 2020-05-02 NOTE — Telephone Encounter (Signed)
Sorry to hear she is not feeling any better  Fluids , rest and tylenol As needed    Zpack take as directed for possible Bronchitis   Zpack #1 tad , no refills.   If not mproving needs to go to ER  Please contact office for sooner follow up if symptoms do not improve or worsen or seek emergency care

## 2020-05-02 NOTE — Telephone Encounter (Signed)
Primary Pulmonologist: Dr Vaughan Browner Last office visit and with whom: Tammy,NP- 04/29/20 What do we see them for (pulmonary problems): asthma bronchitis Last OV assessment/plan:  Instructions    Return in about 6 weeks (around 06/10/2020) for Follow up with Dr. Vaughan Browner. Prednisone taper over next week  Mucinex DM Twice daily  As needed  Cough/congestion  Albuterol inhaler or neb As needed  As needed for wheezing .  Continue on Symbicort 2 puffs Twice daily  , rinse after use.  Follow up with Dr. Vaughan Browner in 6-8 weeks and As needed   Please contact office for sooner follow up if symptoms do not improve or worsen or seek emergency care   Call primary provider if blood sugars are >250 . Prednisone can cause elevated blood sugars.        Reason for call:  Called and spoke with Patient's Husband, Audelia Hives.  Audelia Hives stated Patient is not getting better.  Audelia Hives stated Patient is having increased cough, and some body aches.  Audelia Hives stated Patient is taking Mucinex as instructed, and started Prednisone. Audelia Hives stated Patient is having a productive cough, but he is unsure of the color of sputum.  Audelia Hives stated Patient is not taking any OTC cough meds at this time. Audelia Hives stated Patient has been fully vaccinated, with Pfizer Covid vaccines. Audelia Hives requested any prescriptions to go to [place]. Allergies  Allergen Reactions  . Ibuprofen Swelling  . Iodinated Diagnostic Agents Anaphylaxis  . Iodine Anaphylaxis  . Metrizamide Anaphylaxis  . Penicillins Swelling  . Sulfa Antibiotics Anaphylaxis  . Sulfasalazine Anaphylaxis  . Other Swelling  . Avelox [Moxifloxacin Hcl]   . Codeine   . Shellfish Allergy   . Sitagliptin Other (See Comments)    Immunization History  Administered Date(s) Administered  . PFIZER SARS-COV-2 Vaccination 11/24/2019, 12/15/2019  . Unspecified SARS-COV-2 Vaccination 11/24/2019    Message routed to Tammy,NP to advise

## 2020-06-03 ENCOUNTER — Ambulatory Visit: Payer: BC Managed Care – PPO | Admitting: Pulmonary Disease

## 2020-06-10 ENCOUNTER — Ambulatory Visit: Payer: BC Managed Care – PPO | Admitting: Adult Health

## 2021-10-19 IMAGING — DX DG CHEST 2V
2 series · 2 of 2 positions shown · non-contrast
Comparison: 06/20/2018

CLINICAL DATA: Cough and congestion.  COVID exposure

EXAM:
CHEST - 2 VIEW

[chest pa]
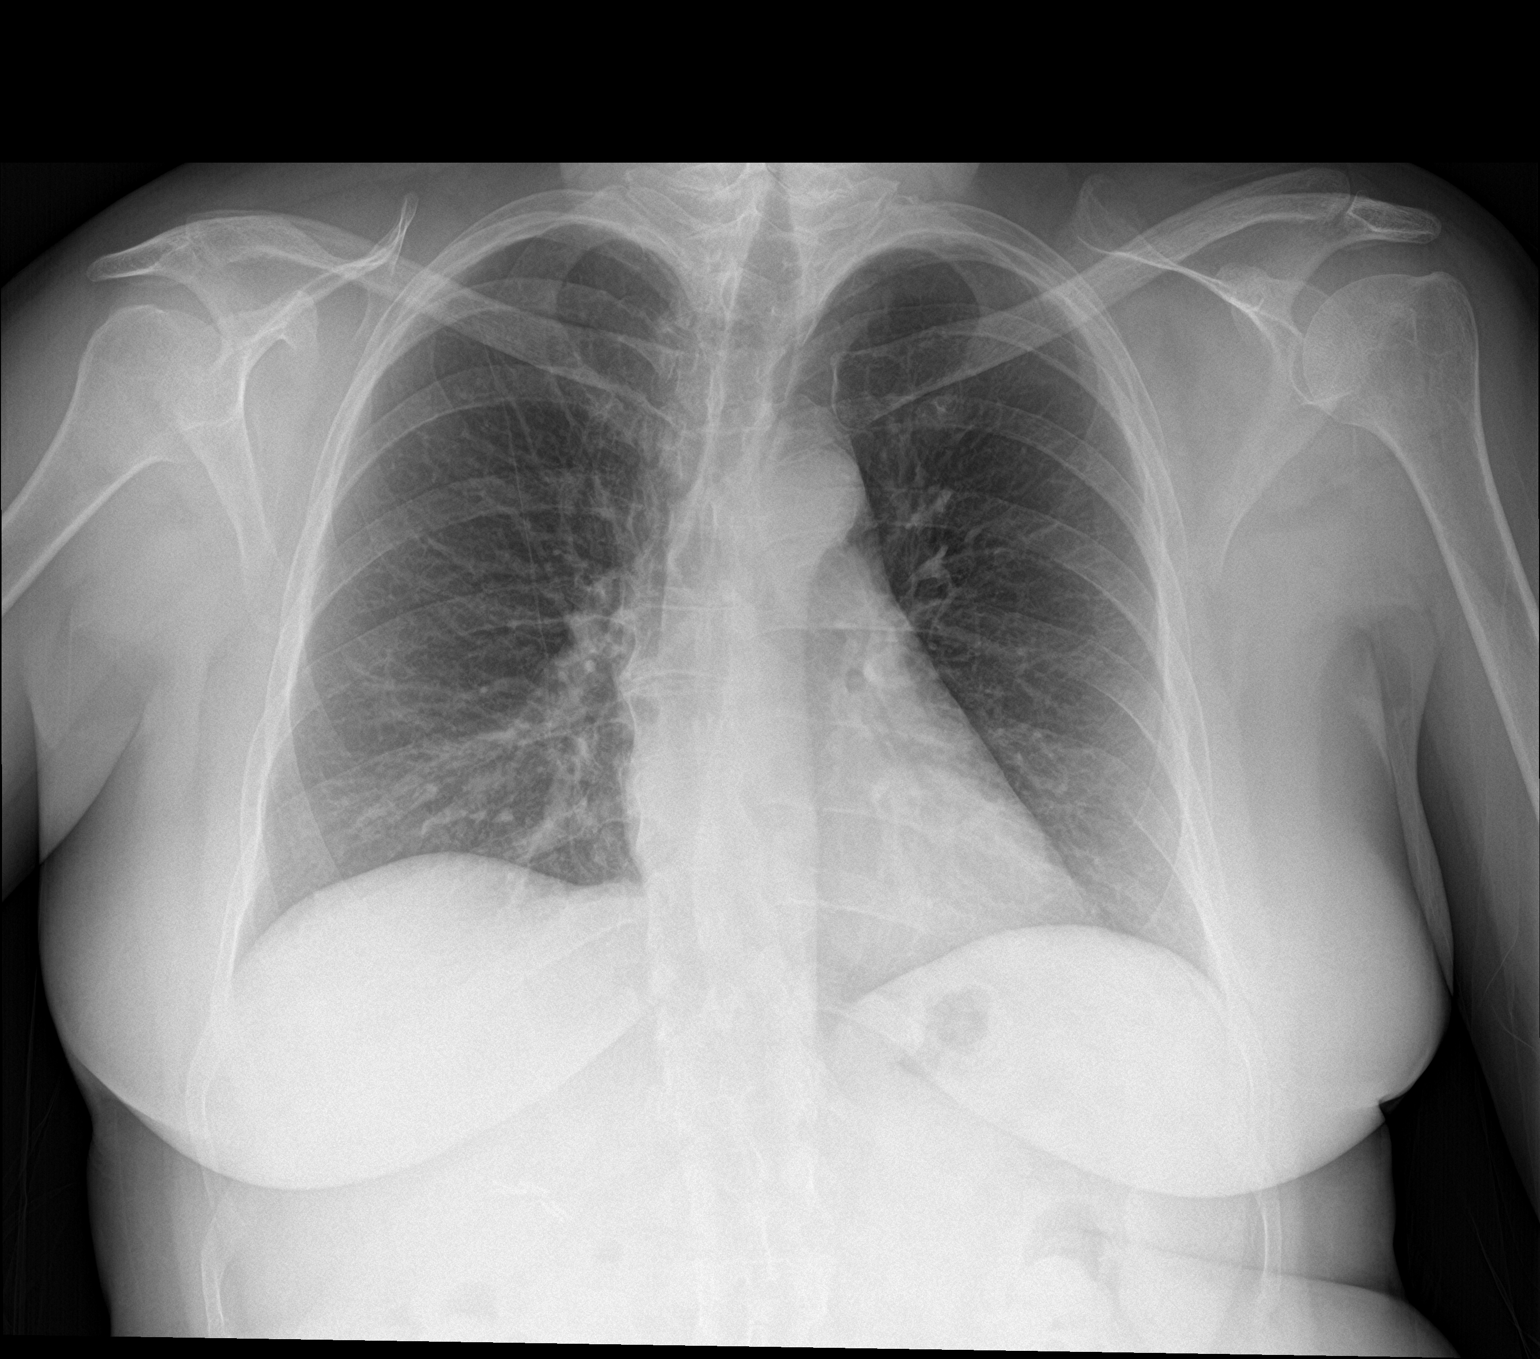

[chest lat]
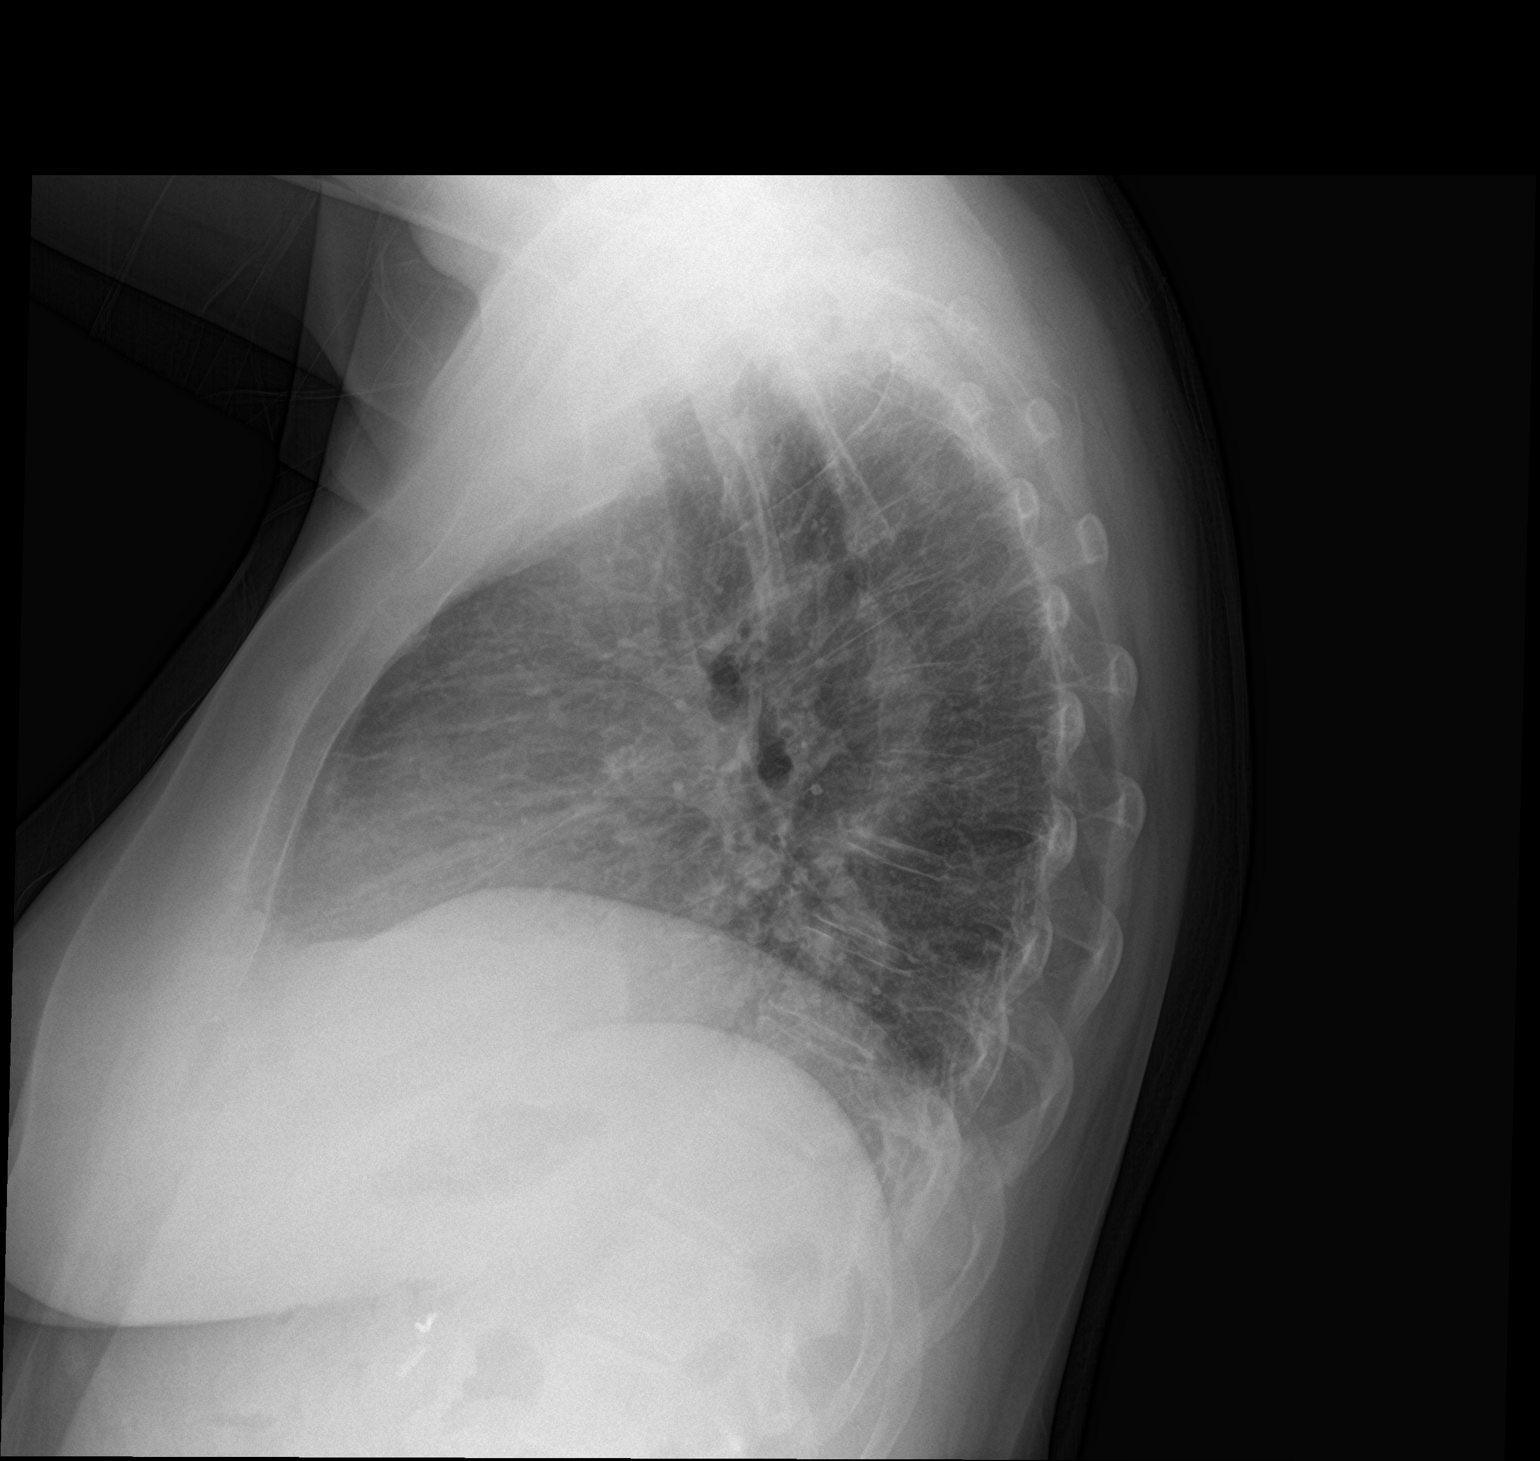

[2 of 2 positions shown; findings below may reference images not displayed]

FINDINGS: Normal heart size and mediastinal contours. No acute infiltrate or
edema. No effusion or pneumothorax. No acute osseous findings.
Cholecystectomy clips.
IMPRESSION: No active cardiopulmonary disease.

## 2023-02-27 ENCOUNTER — Emergency Department (HOSPITAL_COMMUNITY)
Admission: EM | Admit: 2023-02-27 | Discharge: 2023-02-28 | Discharge status: Against Medical Advice | Payer: Medicare Other | Attending: Emergency Medicine | Admitting: Emergency Medicine

## 2023-02-27 ENCOUNTER — Other Ambulatory Visit: Payer: Self-pay

## 2023-02-27 ENCOUNTER — Encounter (HOSPITAL_COMMUNITY): Payer: Self-pay

## 2023-02-27 DIAGNOSIS — Z5321 Procedure and treatment not carried out due to patient leaving prior to being seen by health care provider: Secondary | ICD-10-CM | POA: Diagnosis not present

## 2023-02-27 DIAGNOSIS — H9201 Otalgia, right ear: Secondary | ICD-10-CM | POA: Insufficient documentation

## 2023-02-27 NOTE — ED Triage Notes (Signed)
Complaining of pain in the rt ear that started 2 weeks ago, now she had that it feels like her neck is swollen and hurting. She feels like there is a knocking in the ear. It is starting to make her feel nausous.

## 2023-02-28 NOTE — ED Notes (Signed)
Pt stated that had to leave, Can't wait any longer
